# Patient Record
Sex: Female | Born: 1980 | Race: White | Hispanic: No | Marital: Married | Smoking: Never smoker
Health system: Southern US, Community
[De-identification: ages and names within clinical notes are randomized; demographics above are authoritative.]

## PROBLEM LIST (undated history)

## (undated) DIAGNOSIS — F988 Other specified behavioral and emotional disorders with onset usually occurring in childhood and adolescence: Secondary | ICD-10-CM

## (undated) DIAGNOSIS — Z8619 Personal history of other infectious and parasitic diseases: Secondary | ICD-10-CM

## (undated) DIAGNOSIS — R87619 Unspecified abnormal cytological findings in specimens from cervix uteri: Secondary | ICD-10-CM

## (undated) DIAGNOSIS — IMO0002 Reserved for concepts with insufficient information to code with codable children: Secondary | ICD-10-CM

## (undated) DIAGNOSIS — R87629 Unspecified abnormal cytological findings in specimens from vagina: Secondary | ICD-10-CM

## (undated) HISTORY — PX: COLPOSCOPY: SHX161

## (undated) HISTORY — DX: Unspecified abnormal cytological findings in specimens from cervix uteri: R87.619

## (undated) HISTORY — DX: Personal history of other infectious and parasitic diseases: Z86.19

## (undated) HISTORY — DX: Reserved for concepts with insufficient information to code with codable children: IMO0002

## (undated) HISTORY — PX: OTHER SURGICAL HISTORY: SHX169

---

## 2001-02-18 ENCOUNTER — Other Ambulatory Visit: Admission: RE | Admit: 2001-02-18 | Discharge: 2001-02-18 | Payer: Self-pay | Admitting: Obstetrics and Gynecology

## 2002-02-20 ENCOUNTER — Other Ambulatory Visit: Admission: RE | Admit: 2002-02-20 | Discharge: 2002-02-20 | Payer: Self-pay | Admitting: Obstetrics and Gynecology

## 2003-02-21 ENCOUNTER — Other Ambulatory Visit: Admission: RE | Admit: 2003-02-21 | Discharge: 2003-02-21 | Payer: Self-pay | Admitting: Obstetrics and Gynecology

## 2004-11-18 ENCOUNTER — Other Ambulatory Visit: Admission: RE | Admit: 2004-11-18 | Discharge: 2004-11-18 | Payer: Self-pay | Admitting: Obstetrics and Gynecology

## 2009-07-27 NOTE — L&D Delivery Note (Addendum)
ADMIT DATE          09/13/2009  DISCHARGE DATE       .        MOTHER'S DEMOGRAPHICS  ADMISSION DATE: 09/13/2009 07:25  PHYSICIAN GROUP: Karel Jarvis Staci Acosta)  DELIVERY PROVIDER: Burnell Blanks MD  MD REASON FOR ADMISSION: labor  ANTEPARTUM COMPLICATIONS: No Antepartum Complications  GA AT DELIVERY (Weeks.Days): 40.2  G / P: 4  FULL TERM: 2  LIVING: 2    DELIVERY INFORMATION  DELIVERY DATE AND TIME: 09/13/2009 10:54  PLACE OF DELIVERY: [facility]  TYPE OF DELIVERY: Vaginal  TYPE OF VAGINAL DELIVERY: Spontaneous  TYPE OF PLACENTA DELIVERY: Spontaneous (w/ Vag. Delivery)  EPISIOTOMY : No Episiotomy  LACERATION: No Laceration  EXTENTION: No Extension  OTHER PROCEDURES WITH DELIVERY: None  DELIVERY COMPLICATIONS: No Delivery Complications  TOTAL EBL: 300    INFANT INFORMATION  NEONATE STATUS AT DELIVERY: Living  INFANT SEX: Female  INFANT BIRTH WEIGHT (gms): 4312  TOTAL 1 MINUTE APGAR: 9  TOTAL 5 MINUTE APGAR: 9    MD ATTESTATION  ATTESTATION STATEMENT: I was present throughout the entire procedure.  PROVIDER DELIVERY NOTE: UNEVENTFUL SPONTANEOUS VAGINAL DELIVERY OF A LIVE  FEMALE INFANT  ATTESTATION STATEMENT SIGNATURE: Harlow Mares, Johnedward Brodrick.D.                    Electronically Signed and Finalized  by  Harlow Mares, MD 09/17/2009  13:44  ___________________________________________  Harlow Mares, MD      DD:   09/15/2009  DT:   09/17/2009  7:49 A  MRN/AUT#[id number]      cc:

## 2009-08-15 ENCOUNTER — Ambulatory Visit
Admit: 2009-08-15 | Discharge: 2009-08-15 | Disposition: A | Discharge status: Home / Self Care | Payer: Self-pay | Source: Ambulatory Visit | Admitting: Obstetrics and Gynecology

## 2009-08-19 LAB — GROUP B STREP CULTURE

## 2009-09-13 ENCOUNTER — Encounter: Payer: Self-pay | Admitting: Obstetrics and Gynecology

## 2009-09-13 ENCOUNTER — Inpatient Hospital Stay
Admit: 2009-09-13 | Disposition: A | Discharge status: Home / Self Care | Payer: Self-pay | Source: Ambulatory Visit | Attending: Obstetrics and Gynecology | Admitting: Obstetrics and Gynecology

## 2009-09-13 LAB — CBC AND DIFFERENTIAL
Baso # K/uL: 0 THOU/uL (ref 0.0–0.1)
Basophil %: 0.2 % (ref 0.1–1.2)
Eos # K/uL: 0.1 THOU/uL (ref 0.0–0.4)
Eosinophil %: 0.7 % (ref 0.7–5.8)
Hematocrit: 35 % (ref 34–45)
Hemoglobin: 11.9 g/dL (ref 11.2–15.7)
Lymph # K/uL: 1.9 THOU/uL (ref 1.2–3.7)
Lymphocyte %: 19.2 % — ABNORMAL LOW (ref 19.3–51.7)
MCV: 85 fL (ref 79–95)
Mono # K/uL: 0.8 THOU/uL (ref 0.2–0.9)
Monocyte %: 8.4 % (ref 4.7–12.5)
Neut # K/uL: 6.9 THOU/uL — ABNORMAL HIGH (ref 1.6–6.1)
Platelets: 205 THOU/uL (ref 160–370)
RBC: 4.1 MIL/uL (ref 3.9–5.2)
RDW: 13.7 % (ref 11.7–14.4)
Seg Neut %: 71.1 % (ref 34.0–71.1)
WBC: 9.7 THOU/uL (ref 4.0–10.0)

## 2009-09-13 LAB — HOLD SST

## 2009-09-13 LAB — TYPE AND SCREEN
ABO RH Blood Type: O POS
Antibody Screen: NEGATIVE

## 2009-09-13 LAB — RPR: RPR Screen: NONREACTIVE

## 2010-04-01 ENCOUNTER — Ambulatory Visit: Payer: Self-pay | Admitting: Vascular Surgery

## 2010-07-19 ENCOUNTER — Emergency Department
Admit: 2010-07-19 | Disposition: A | Discharge status: Home / Self Care | Payer: Self-pay | Source: Ambulatory Visit | Attending: Emergency Medicine | Admitting: Emergency Medicine

## 2010-07-19 ENCOUNTER — Other Ambulatory Visit: Payer: Self-pay | Admitting: Gastroenterology

## 2010-07-19 LAB — CBC AND DIFFERENTIAL
Baso # K/uL: 0 THOU/uL (ref 0.0–0.1)
Basophil %: 0.1 % (ref 0.1–1.2)
Eos # K/uL: 0 THOU/uL (ref 0.0–0.4)
Eosinophil %: 0.3 % — ABNORMAL LOW (ref 0.7–5.8)
Hematocrit: 36 % (ref 34–45)
Hemoglobin: 12.2 g/dL (ref 11.2–15.7)
Lymph # K/uL: 0.7 THOU/uL — ABNORMAL LOW (ref 1.2–3.7)
Lymphocyte %: 10 % — ABNORMAL LOW (ref 19.3–51.7)
MCV: 86 fL (ref 79–95)
Mono # K/uL: 0.5 THOU/uL (ref 0.2–0.9)
Monocyte %: 6.6 % (ref 4.7–12.5)
Neut # K/uL: 5.6 THOU/uL (ref 1.6–6.1)
Platelets: 186 THOU/uL (ref 160–370)
RBC: 4.2 MIL/uL (ref 3.9–5.2)
RDW: 13.8 % (ref 11.7–14.4)
Seg Neut %: 82.9 % — ABNORMAL HIGH (ref 34.0–71.1)
WBC: 6.8 THOU/uL (ref 4.0–10.0)

## 2010-07-19 LAB — POCT URINALYSIS DIPSTICK
Glucose,UA: NORMAL
Leuk Esterase,UA: NEGATIVE
Lot #: 20735301
Nitrite,UA POCT: NEGATIVE
PH,Ur: 5

## 2010-07-19 LAB — BLOOD BANK HOLD LAVENDER

## 2010-07-19 LAB — COMPREHENSIVE METABOLIC PANEL
ALT: 13 U/L (ref 0–35)
AST: 17 U/L (ref 0–35)
Albumin: 4.3 g/dL (ref 3.5–5.2)
Alk Phos: 64 U/L (ref 35–105)
Anion Gap: 12 (ref 7–16)
Bilirubin,Total: 0.4 mg/dL (ref 0.0–1.2)
CO2: 23 mmol/L (ref 20–28)
Calcium: 8.5 mg/dL — ABNORMAL LOW (ref 8.8–10.2)
Chloride: 103 mmol/L (ref 96–108)
Creatinine: 0.59 mg/dL (ref 0.51–0.95)
GFR,Black: >59 *
GFR,Caucasian: >59 *
Globulin: 2.5 g/dL — ABNORMAL LOW (ref 2.7–4.3)
Glucose: 88 mg/dL (ref 74–106)
Lab: 9 mg/dL (ref 6–20)
Potassium: 3.8 mmol/L (ref 3.3–5.1)
Sodium: 138 mmol/L (ref 133–145)
Total Protein: 6.8 g/dL (ref 6.3–7.7)

## 2010-07-19 MED ORDER — ACETAMINOPHEN-CODEINE 300-30 MG PO TABS *I*
1.0000 | ORAL_TABLET | ORAL | Status: AC | PRN
Start: 2010-07-19 — End: 2010-07-29

## 2010-07-19 NOTE — ED Provider Notes (Addendum)
 History   Chief Complaint   Patient presents with   . Fever     Pt had D&C on Wed. last week d/t miscarriage.  Started with fever this morning.  Pelvic and back pain.  No antipyretics taken this morning.  Pt noted minimal to mod amount bleeding.  G4 P3       HPI Comments: 37F healthy pw low grade fever and mild lower abd pain s/p d&c three days prior.  + Chills.  Has had decrease ambulation overall in the last two days.  Walking today and felt winded and chills. So she came in for evaluation.  On arrival she is mildly tachycarcia.      BP 110/68  Pulse 106  Temp(Src) 36.8 C (98.2 F) (Temporal)  Resp 18  Ht 1.676 m (5\' 6" )  Wt 72.576 kg (160 lb)  BMI 25.82 kg/m2  SpO2 100%  LMP 05/07/2010  Breastfeeding? Unknown    Per triage note: Pt had D&C on Wed. last week d/t miscarriage.  Started with fever this morning.  Pelvic and back pain.  No antipyretics taken this morning.  Pt noted minimal to mod amount bleeding.  G4 P3      History reviewed.  No pertinent past medical history.    No past surgical history on file.    No family history on file.         Review of Systems   Review of Systems   Constitutional: Positive for fever and fatigue.   Respiratory: Positive for cough.    All other systems reviewed and are negative.        Physical Exam   BP 110/68  Pulse 106  Temp(Src) 36.8 C (98.2 F) (Temporal)  Resp 18  Ht 1.676 m (5\' 6" )  Wt 72.576 kg (160 lb)  BMI 25.82 kg/m2  SpO2 100%  LMP 05/07/2010  Breastfeeding? Unknown    Physical Exam  Physical Exam   Constitutional: She is oriented to person, place, and time. She appears well-developed and well-nourished  HENT: mmm, no facial swelling   Head: Normocephalic and atraumatic.   Eyes: Conjunctivae and EOM are normal. PERRL  Neck: Neck supple w/ full ROM    Cardiovascular: RRR, no appreciable m/g, no palpable rib tenderness   Pulmonary/Chest: nonlabored breathing w/ nl effort & no wheezing or crackles     Abdominal: mild tenderness with palpation    Musculoskeletal: nl rom, no limb swelling, symmetric strength   Neurological: nonfocal   Skin: Skin is warm and dry. She is not diaphoretic. no appreciable rashes   GU: no active vaginal bleeding. Mild suprapubic pain with deep external pressure    Medical Decision Making   MDM  Low grade fever, tachycardia and mild abdominal pain in otherwise healthy post op pt.   Wbc - neg for infection.  Nl labs.   addl wkup-- u/a, cxr - nl lung expansion, no evidence of of infiltrate or ptx     Amount and/or Complexity of Data Reviewed  Clinical lab tests: ordered and reviewed  Tests in the radiology section of CPT: ordered and reviewed  Decide to obtain previous medical records or to obtain history from someone other than the patient: yes  Review and summarize past medical records: yes  Independent visualization of images, tracings, or specimens: yes    Siriah Treat B Warren Lacy, MD    Final disposition:   Home with incentive spirometry and reassurance     Will Bonnet, MD  07/19/10 1254  07/19/2010, 2:48 PM  I have discussed case with pt OBGYN informing him of our workup and low suspicion for endometritis.      Will Bonnet, MD  07/19/10 [phone]
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
## 2010-07-19 NOTE — Discharge Instructions (Signed)
Based on our evaluation it appears that you are stable for discharge.  Your concerns are relevant, but do not require emergency care or inpatient hospitalization at this time.   We encourage you to follow up with your obgyn for further evaluation of your post operative concerns.     Return to the ED if you have any worsening of your symptoms including severe chest pain, shortness of breath, *.  Please see additional discharge instructions attached for further patient education regarding the concerns that you had  Thank you for allowing [name] to care for you today.     If you do not have a doctor, please establish a primary care physician by calling the  [facility] at [phone] or Family Medicine at [phone].   If you are unable to get an appointment, you may also call the [facility] to establish a health care at [phone].

## 2010-08-22 LAB — EKG 12-LEAD
P: 62 degrees
QRS: 80 degrees
Rate: 93 {beats}/min
Severity: BORDERLINE
Severity: BORDERLINE
Statement: BORDERLINE
T: -24 degrees

## 2010-12-09 NOTE — Procedures (Signed)
VASCULAR LAB EXAM   INDICATION:  Right calf pain and discoloration for approximately 10  years.   HISTORY:  Diabetes:  No.  Cardiac:  No.  Hypertension:  No.   EXAM:  Duplex of left distal calf.   IMPRESSION:  1. No evidence of abnormal arterial or venous flow in the left distal      calf.  2. No evidence of AVF (arteriovenous fistula) or pseudoaneurysm.  3. Tiny superficial veins visualized in area of discoloration.   ___________________________________________  Quita Skye Hart [place], M.D.   AS/MEDQ  D:  04/02/2010  T:  04/02/2010  Job:  [id number]

## 2010-12-09 NOTE — Consult Note (Signed)
NEW PATIENT CONSULTATION   Mackenzie Howard, Mackenzie Howard  DOB:  Dec 01, 1980                                       04/01/2010  KGMWN#:[id number]   The patient is a healthy 30 year old female referred for evaluation of a  prominent lump in her left ankle area.  She states that about 8 or 9  years ago a softball struck this area and since that time she has had  some local swelling and occasional mild discomfort.  She has had no  history of any fracture, deep venous thrombosis, thrombophlebitis,  claudication or infection in the lower extremity.  It is not really  painful unless one touches it and manipulates it.   CHRONIC MEDICAL PROBLEMS:  None.  Denies any diabetes, hypertension,  hyperlipidemia, coronary artery disease, COPD.   SOCIAL HISTORY:  She is married, has no children.  She is an  Acupuncturist at [employer].  Does not use tobacco.  Drinks  occasional alcohol.   FAMILY HISTORY:  Negative for coronary artery disease, diabetes or  stroke.   REVIEW OF SYSTEMS:  Totally negative.  Denies any symptoms abnormal.   PHYSICAL EXAMINATION:  Vital signs:  Blood pressure 94/64, heart rate  67, respirations 14, temperature 98.  General:  She is a well-developed,  well-nourished female in no apparent distress, alert and oriented x3.  HEENT:  Exam normal for age.  EOMs intact.  Lungs:  Clear to  auscultation.  Cardiovascular:  Regular rhythm.  No murmurs.  Carotid  pulses 3+.  No audible bruits.  Abdomen:  Soft, nontender with no  masses.  Musculoskeletal:  Exam is free of major deformities.  Neurological:  Normal.  Skin:  Free of rashes.  Lower extremity:  Exam  reveals 3+ femoral, popliteal and posterior tibial pulses palpable  bilaterally.  There is an area of some localized mild swelling proximal  to left medial malleolus over the distal saphenous vein with a few  prominent venules beneath the skin.  There is no bruit or evidence of AV  fistula by auscultation.   There is no calf edema and the calves are of  equal circumference bilaterally.   I ordered a venous ultrasound of this area to check for any arterial  venous connections or abnormalities and the study was completely normal.  No evidence of AV fistula or pseudoaneurysm.  I reassured her regarding  these findings, do not think there is any treatment available nor is  there anything to be concerned about and she will return to see [name] on a  p.r.n. basis.     Quita Skye Hart [place], M.D.  Electronically Signed   JDL/MEDQ  D:  04/01/2010  T:  04/02/2010  Job:  [id number]   cc:   Gretta Arab. Valentina Lucks, M.D.

## 2012-07-05 LAB — OB RESULTS CONSOLE RPR: RPR: NONREACTIVE

## 2012-07-05 LAB — OB RESULTS CONSOLE ABO/RH: RH Type: POSITIVE

## 2013-01-09 LAB — OB RESULTS CONSOLE GBS: GBS: NEGATIVE

## 2013-02-01 ENCOUNTER — Encounter (HOSPITAL_COMMUNITY): Payer: Self-pay | Admitting: *Deleted

## 2013-02-01 ENCOUNTER — Telehealth (HOSPITAL_COMMUNITY): Payer: Self-pay | Admitting: *Deleted

## 2013-02-01 NOTE — Telephone Encounter (Signed)
Preadmission screen  

## 2013-02-11 ENCOUNTER — Inpatient Hospital Stay (HOSPITAL_COMMUNITY)
Admission: RE | Admit: 2013-02-11 | Discharge: 2013-02-14 | DRG: 775 | Disposition: A | Discharge status: Home / Self Care | Payer: 59 | Source: Ambulatory Visit | Attending: Obstetrics and Gynecology | Admitting: Obstetrics and Gynecology

## 2013-02-11 ENCOUNTER — Encounter (HOSPITAL_COMMUNITY): Payer: Self-pay

## 2013-02-11 LAB — CBC
HCT: 35.7 % — ABNORMAL LOW (ref 36.0–46.0)
Hemoglobin: 12.1 g/dL (ref 12.0–15.0)
MCH: 30.8 pg (ref 26.0–34.0)
MCV: 90.8 fL (ref 78.0–100.0)
RBC: 3.93 MIL/uL (ref 3.87–5.11)

## 2013-02-11 MED ORDER — OXYCODONE-ACETAMINOPHEN 5-325 MG PO TABS
1.0000 | ORAL_TABLET | ORAL | Status: DC | PRN
  Administered 2013-02-12: 1 via ORAL
  Filled 2013-02-11: qty 1

## 2013-02-11 MED ORDER — ONDANSETRON HCL 4 MG/2ML IJ SOLN: 4.0000 mg | Freq: Four times a day (QID) | INTRAMUSCULAR | Status: DC | PRN

## 2013-02-11 MED ORDER — TERBUTALINE SULFATE 1 MG/ML IJ SOLN: 0.2500 mg | Freq: Once | INTRAMUSCULAR | Status: AC | PRN

## 2013-02-11 MED ORDER — FLEET ENEMA 7-19 GM/118ML RE ENEM: 1.0000 | ENEMA | Freq: Every day | RECTAL | Status: DC | PRN

## 2013-02-11 MED ORDER — ACETAMINOPHEN 325 MG PO TABS: 650.0000 mg | ORAL_TABLET | ORAL | Status: DC | PRN

## 2013-02-11 MED ORDER — BUTORPHANOL TARTRATE 1 MG/ML IJ SOLN
1.0000 mg | INTRAMUSCULAR | Status: DC | PRN
  Administered 2013-02-12: 1 mg via INTRAVENOUS
  Filled 2013-02-11: qty 1

## 2013-02-11 MED ORDER — LACTATED RINGERS IV SOLN: 500.0000 mL | INTRAVENOUS | Status: DC | PRN

## 2013-02-11 MED ORDER — CITRIC ACID-SODIUM CITRATE 334-500 MG/5ML PO SOLN: 30.0000 mL | ORAL | Status: DC | PRN

## 2013-02-11 MED ORDER — OXYTOCIN 40 UNITS IN LACTATED RINGERS INFUSION - SIMPLE MED
62.5000 mL/h | INTRAVENOUS | Status: DC
  Administered 2013-02-12: 62.5 mL/h via INTRAVENOUS
  Filled 2013-02-11: qty 1000

## 2013-02-11 MED ORDER — OXYTOCIN BOLUS FROM INFUSION: 500.0000 mL | INTRAVENOUS | Status: DC

## 2013-02-11 MED ORDER — MISOPROSTOL 25 MCG QUARTER TABLET
25.0000 ug | ORAL_TABLET | ORAL | Status: DC | PRN
  Administered 2013-02-11 – 2013-02-12 (×2): 25 ug via VAGINAL
  Filled 2013-02-11 (×2): qty 0.25

## 2013-02-11 MED ORDER — LIDOCAINE HCL (PF) 1 % IJ SOLN: 30.0000 mL | INTRAMUSCULAR | Status: DC | PRN

## 2013-02-11 MED ORDER — LACTATED RINGERS IV SOLN
INTRAVENOUS | Status: DC
  Administered 2013-02-12 (×2): via INTRAVENOUS

## 2013-02-11 MED ORDER — ZOLPIDEM TARTRATE 5 MG PO TABS: 5.0000 mg | ORAL_TABLET | Freq: Every evening | ORAL | Status: DC | PRN

## 2013-02-11 MED ORDER — IBUPROFEN 600 MG PO TABS
600.0000 mg | ORAL_TABLET | Freq: Four times a day (QID) | ORAL | Status: DC | PRN
  Administered 2013-02-12: 600 mg via ORAL
  Filled 2013-02-11: qty 1

## 2013-02-11 NOTE — H&P (Signed)
Mackenzie Howard is a 32 y.o. female presenting for IOL. Denies ROM/bleeding, no HA/blurry vision, no epigastric pain.                                                                                                                                                                                Maternal Medical History:  Fetal activity: Perceived fetal activity is normal.      OB History   Grav Para Term Preterm Abortions TAB SAB Ect Mult Living   1              Past Medical History  Diagnosis Date  . Hx of varicella   . Abnormal Pap smear    Past Surgical History  Procedure Laterality Date  . Colposcopy     Family History: family history includes Cancer in her maternal grandfather; Heart disease in her maternal grandfather; and Hypertension in her father. Social History:  reports that she has never smoked. She has never used smokeless tobacco. She reports that she does not drink alcohol or use illicit drugs.   Prenatal Transfer Tool  Maternal Diabetes: No Genetic Screening: Normal Maternal Ultrasounds/Referrals: Normal Fetal Ultrasounds or other Referrals:  None Maternal Substance Abuse:  No Significant Maternal Medications:  None Significant Maternal Lab Results:  None Other Comments:  None  Review of Systems  Eyes: Negative for blurred vision.  Gastrointestinal: Negative for abdominal pain.  Neurological: Negative for headaches.    Dilation: 1.5 Effacement (%): 70 Station: -2 Exam by:: JDaley Blood pressure 106/75, pulse 85, temperature 97.8 F (36.6 C), temperature source Oral, resp. rate 18, height 5\' 4"  (1.626 m), weight 174 lb (78.926 kg), last menstrual period 05/07/2012. Maternal Exam:  Uterine Assessment: Contraction strength is mild.  Contraction frequency is irregular.      Fetal Exam Fetal Monitor Review: Pattern: accelerations present.       Physical Exam  Cardiovascular: Normal rate and regular rhythm.   Respiratory: Effort normal and breath sounds  normal.  GI: Soft. There is no tenderness.  Neurological: She has normal reflexes.    Prenatal labs: ABO, Rh: O/Positive/-- (12/10 0000) Antibody: Negative (12/10 0000) Rubella: Immune (12/10 0000) RPR: Nonreactive (12/10 0000)  HBsAg: Negative (12/10 0000)  HIV: Non-reactive (12/10 0000)  GBS: Negative (06/16 0000)   Assessment/Plan: 32 yo G1P0 at term for IOL Induction and risks have been reviewed Two stage cytotec induction   Mackenzie Howard,Mackenzie Howard 02/11/2013, 9:11 PM

## 2013-02-12 ENCOUNTER — Encounter (HOSPITAL_COMMUNITY): Payer: Self-pay

## 2013-02-12 ENCOUNTER — Inpatient Hospital Stay (HOSPITAL_COMMUNITY): Payer: 59 | Admitting: Anesthesiology

## 2013-02-12 ENCOUNTER — Inpatient Hospital Stay (HOSPITAL_COMMUNITY): Admission: AD | Admit: 2013-02-12 | Payer: Self-pay | Source: Ambulatory Visit | Admitting: Obstetrics and Gynecology

## 2013-02-12 ENCOUNTER — Encounter (HOSPITAL_COMMUNITY): Payer: Self-pay | Admitting: Anesthesiology

## 2013-02-12 MED ORDER — FENTANYL 2.5 MCG/ML BUPIVACAINE 1/10 % EPIDURAL INFUSION (WH - ANES)
INTRAMUSCULAR | Status: DC | PRN
  Administered 2013-02-12: 14 mL/h via EPIDURAL

## 2013-02-12 MED ORDER — FLEET ENEMA 7-19 GM/118ML RE ENEM: 1.0000 | ENEMA | Freq: Every day | RECTAL | Status: DC | PRN

## 2013-02-12 MED ORDER — BISACODYL 10 MG RE SUPP: 10.0000 mg | Freq: Every day | RECTAL | Status: DC | PRN

## 2013-02-12 MED ORDER — ONDANSETRON HCL 4 MG/2ML IJ SOLN: 4.0000 mg | INTRAMUSCULAR | Status: DC | PRN

## 2013-02-12 MED ORDER — SIMETHICONE 80 MG PO CHEW: 80.0000 mg | CHEWABLE_TABLET | ORAL | Status: DC | PRN

## 2013-02-12 MED ORDER — FENTANYL 2.5 MCG/ML BUPIVACAINE 1/10 % EPIDURAL INFUSION (WH - ANES)
14.0000 mL/h | INTRAMUSCULAR | Status: DC | PRN
  Filled 2013-02-12: qty 125

## 2013-02-12 MED ORDER — EPHEDRINE 5 MG/ML INJ: 10.0000 mg | INTRAVENOUS | Status: DC | PRN

## 2013-02-12 MED ORDER — OXYTOCIN 40 UNITS IN LACTATED RINGERS INFUSION - SIMPLE MED
1.0000 m[IU]/min | INTRAVENOUS | Status: DC
  Administered 2013-02-12: 4 m[IU]/min via INTRAVENOUS
  Administered 2013-02-12: 8 m[IU]/min via INTRAVENOUS
  Administered 2013-02-12: 2 m[IU]/min via INTRAVENOUS
  Administered 2013-02-12: 6 m[IU]/min via INTRAVENOUS

## 2013-02-12 MED ORDER — LIDOCAINE HCL (PF) 1 % IJ SOLN
INTRAMUSCULAR | Status: DC | PRN
  Administered 2013-02-12 (×2): 4 mL

## 2013-02-12 MED ORDER — BENZOCAINE-MENTHOL 20-0.5 % EX AERO
1.0000 "application " | INHALATION_SPRAY | CUTANEOUS | Status: DC | PRN
  Filled 2013-02-12 (×2): qty 56

## 2013-02-12 MED ORDER — EPHEDRINE 5 MG/ML INJ
10.0000 mg | INTRAVENOUS | Status: DC | PRN
  Filled 2013-02-12: qty 4

## 2013-02-12 MED ORDER — TETANUS-DIPHTH-ACELL PERTUSSIS 5-2.5-18.5 LF-MCG/0.5 IM SUSP: 0.5000 mL | Freq: Once | INTRAMUSCULAR | Status: DC

## 2013-02-12 MED ORDER — DIPHENHYDRAMINE HCL 25 MG PO CAPS: 25.0000 mg | ORAL_CAPSULE | Freq: Four times a day (QID) | ORAL | Status: DC | PRN

## 2013-02-12 MED ORDER — PHENYLEPHRINE 40 MCG/ML (10ML) SYRINGE FOR IV PUSH (FOR BLOOD PRESSURE SUPPORT)
80.0000 ug | PREFILLED_SYRINGE | INTRAVENOUS | Status: DC | PRN
  Filled 2013-02-12: qty 5

## 2013-02-12 MED ORDER — WITCH HAZEL-GLYCERIN EX PADS: 1.0000 "application " | MEDICATED_PAD | CUTANEOUS | Status: DC | PRN

## 2013-02-12 MED ORDER — SENNOSIDES-DOCUSATE SODIUM 8.6-50 MG PO TABS
2.0000 | ORAL_TABLET | Freq: Every day | ORAL | Status: DC
  Administered 2013-02-13 (×2): 2 via ORAL

## 2013-02-12 MED ORDER — OXYCODONE-ACETAMINOPHEN 5-325 MG PO TABS
1.0000 | ORAL_TABLET | ORAL | Status: DC | PRN
  Administered 2013-02-13: 1 via ORAL
  Filled 2013-02-12 (×2): qty 1

## 2013-02-12 MED ORDER — TERBUTALINE SULFATE 1 MG/ML IJ SOLN: 0.2500 mg | Freq: Once | INTRAMUSCULAR | Status: DC | PRN

## 2013-02-12 MED ORDER — PHENYLEPHRINE 40 MCG/ML (10ML) SYRINGE FOR IV PUSH (FOR BLOOD PRESSURE SUPPORT): 80.0000 ug | PREFILLED_SYRINGE | INTRAVENOUS | Status: DC | PRN

## 2013-02-12 MED ORDER — DIPHENHYDRAMINE HCL 50 MG/ML IJ SOLN: 12.5000 mg | INTRAMUSCULAR | Status: DC | PRN

## 2013-02-12 MED ORDER — ZOLPIDEM TARTRATE 5 MG PO TABS: 5.0000 mg | ORAL_TABLET | Freq: Every evening | ORAL | Status: DC | PRN

## 2013-02-12 MED ORDER — LANOLIN HYDROUS EX OINT: TOPICAL_OINTMENT | CUTANEOUS | Status: DC | PRN

## 2013-02-12 MED ORDER — DIBUCAINE 1 % RE OINT: 1.0000 "application " | TOPICAL_OINTMENT | RECTAL | Status: DC | PRN

## 2013-02-12 MED ORDER — LACTATED RINGERS IV SOLN: 500.0000 mL | Freq: Once | INTRAVENOUS | Status: DC

## 2013-02-12 MED ORDER — IBUPROFEN 600 MG PO TABS
600.0000 mg | ORAL_TABLET | Freq: Four times a day (QID) | ORAL | Status: DC
  Administered 2013-02-13 – 2013-02-14 (×5): 600 mg via ORAL
  Filled 2013-02-12 (×5): qty 1

## 2013-02-12 MED ORDER — PRENATAL MULTIVITAMIN CH
1.0000 | ORAL_TABLET | Freq: Every day | ORAL | Status: DC
  Administered 2013-02-13: 1 via ORAL
  Filled 2013-02-12: qty 1

## 2013-02-12 MED ORDER — ONDANSETRON HCL 4 MG PO TABS: 4.0000 mg | ORAL_TABLET | ORAL | Status: DC | PRN

## 2013-02-12 NOTE — Progress Notes (Signed)
Delivery Note Good progress in second stage (about 50 minutes), FHT reactive Vtx delivers without difficulty. OA Anterior shoulder delivers with normal gentle traction Posterior shoulder will not deliver with regular gentle traction, therefore second degree MLE done and allows easy delivery of posterior shoulder Placenta intact, 3 vessels, EBL 500cc Cx vagina intact First degree antior midline lac not repaired Second degree MLE repaired in layers without difficulty Patient and infant stable in LDR

## 2013-02-12 NOTE — Anesthesia Preprocedure Evaluation (Signed)

## 2013-02-12 NOTE — Progress Notes (Signed)
FHT reactive UCs q2-3 min Cx 3/90/-1/vtx AROM clear Pitocin running

## 2013-02-12 NOTE — Anesthesia Procedure Notes (Signed)
Epidural Patient location during procedure: OB Start time: 02/12/2013 10:38 AM  Staffing Anesthesiologist: Lyzette Reinhardt A. Performed by: anesthesiologist   Preanesthetic Checklist Completed: patient identified, site marked, surgical consent, pre-op evaluation, timeout performed, IV checked, risks and benefits discussed and monitors and equipment checked  Epidural Patient position: sitting Prep: site prepped and draped and DuraPrep Patient monitoring: continuous pulse ox and blood pressure Approach: midline Injection technique: LOR air  Needle:  Needle type: Tuohy  Needle gauge: 17 G Needle length: 9 cm and 9 Needle insertion depth: 5 cm cm Catheter type: closed end flexible Catheter size: 19 Gauge Catheter at skin depth: 10 cm Test dose: negative and Other  Assessment Events: blood not aspirated, injection not painful, no injection resistance, negative IV test and no paresthesia  Additional Notes Patient identified. Risks and benefits discussed including failed block, incomplete  Pain control, post dural puncture headache, nerve damage, paralysis, blood pressure Changes, nausea, vomiting, reactions to medications-both toxic and allergic and post Partum back pain. All questions were answered. Patient expressed understanding and wished to proceed. Sterile technique was used throughout procedure. Epidural site was Dressed with sterile barrier dressing. No paresthesias, signs of intravascular injection Or signs of intrathecal spread were encountered.  Patient was more comfortable after the epidural was dosed. Please see RN's note for documentation of vital signs and FHR which are stable.

## 2013-02-13 ENCOUNTER — Encounter (HOSPITAL_COMMUNITY): Payer: Self-pay

## 2013-02-13 LAB — CBC
Hemoglobin: 10.2 g/dL — ABNORMAL LOW (ref 12.0–15.0)
MCH: 29.7 pg (ref 26.0–34.0)
RBC: 3.43 MIL/uL — ABNORMAL LOW (ref 3.87–5.11)
WBC: 13.4 10*3/uL — ABNORMAL HIGH (ref 4.0–10.5)

## 2013-02-13 NOTE — Progress Notes (Addendum)
Post Partum Day 1 Subjective: no complaints, up ad lib, voiding and tolerating PO  Objective: Blood pressure 100/67, pulse 78, temperature 97.4 F (36.3 C), temperature source Oral, resp. rate 18, height 5\' 4"  (1.626 m), weight 174 lb (78.926 kg), last menstrual period 05/07/2012, SpO2 98.00%.  Physical Exam:  General: alert and cooperative Lochia: appropriate Uterine Fundus: firm Incision: perineum intact, small labial edema noted DVT Evaluation: No evidence of DVT seen on physical exam. Negative Homan's sign. No cords or calf tenderness. No significant calf/ankle edema.   Recent Labs  02/11/13 1955 02/13/13 0555  HGB 12.1 10.2*  HCT 35.7* 31.0*    Assessment/Plan: Plan for discharge tomorrow   LOS: 2 days   Mackenzie Howard G 02/13/2013, 8:05 AM

## 2013-02-13 NOTE — Anesthesia Postprocedure Evaluation (Signed)
Anesthesia Post Note  Patient: Mackenzie Howard  Procedure(s) Performed: * No procedures listed *  Anesthesia type: Epidural  Patient location: Mother/Baby  Post pain: Pain level controlled  Post assessment: Post-op Vital signs reviewed  Last Vitals:  Filed Vitals:   02/13/13 0559  BP: 100/67  Pulse: 78  Temp: 36.3 C  Resp: 18    Post vital signs: Reviewed  Level of consciousness:alert  Complications: No apparent anesthesia complications

## 2013-02-14 MED ORDER — IBUPROFEN 600 MG PO TABS: 600.0000 mg | ORAL_TABLET | Freq: Four times a day (QID) | ORAL | Status: DC

## 2013-02-14 MED ORDER — OXYCODONE-ACETAMINOPHEN 5-325 MG PO TABS: 1.0000 | ORAL_TABLET | ORAL | Status: DC | PRN

## 2013-02-14 NOTE — Discharge Summary (Signed)
Obstetric Discharge Summary Reason for Admission: induction of labor Prenatal Procedures: ultrasound Intrapartum Procedures: spontaneous vaginal delivery Postpartum Procedures: none Complications-Operative and Postpartum: 2 degree perineal laceration Hemoglobin  Date Value Range Status  02/13/2013 10.2* 12.0 - 15.0 g/dL Final     HCT  Date Value Range Status  02/13/2013 31.0* 36.0 - 46.0 % Final    Physical Exam:  General: alert and cooperative Lochia: appropriate Uterine Fundus: firm Incision: perineum intact DVT Evaluation: No evidence of DVT seen on physical exam. Negative Homan's sign. No cords or calf tenderness. No significant calf/ankle edema.  Discharge Diagnoses: Term Pregnancy-delivered  Discharge Information: Date: 02/14/2013 Activity: pelvic rest Diet: routine Medications: PNV, Ibuprofen and Percocet Condition: stable Instructions: refer to practice specific booklet Discharge to: home   Newborn Data: Live born female  Birth Weight: 9 lb 1.7 oz (4131 g) APGAR: 8, 9  Home with mother.  Mackenzie Howard G 02/14/2013, 8:27 AM

## 2013-04-07 DIAGNOSIS — F909 Attention-deficit hyperactivity disorder, unspecified type: Secondary | ICD-10-CM | POA: Insufficient documentation

## 2013-04-27 ENCOUNTER — Encounter: Payer: Self-pay | Admitting: Geriatric Medicine

## 2013-04-27 ENCOUNTER — Emergency Department: Admit: 2013-04-27 | Disposition: A | Discharge status: Home / Self Care | Payer: Self-pay | Source: Ambulatory Visit

## 2013-04-27 HISTORY — DX: Other specified behavioral and emotional disorders with onset usually occurring in childhood and adolescence: F98.8

## 2013-04-27 LAB — POCT URINALYSIS DIPSTICK
Glucose,UA POCT: NORMAL
Ketones,UA POCT: NEGATIVE
Leuk Esterase,UA POCT: NEGATIVE
Lot #: 22916301
Nitrite,UA POCT: NEGATIVE
PH,UA POCT: 8 (ref 5–8)
Protein,UA POCT: NEGATIVE mg/dL

## 2013-04-27 LAB — COMPREHENSIVE METABOLIC PANEL
ALT: 14 U/L (ref 0–35)
AST: 18 U/L (ref 0–35)
Albumin: 4.7 g/dL (ref 3.5–5.2)
Alk Phos: 53 U/L (ref 35–105)
Anion Gap: 11 (ref 7–16)
Bilirubin,Total: 0.3 mg/dL (ref 0.0–1.2)
CO2: 26 mmol/L (ref 20–28)
Calcium: 9.2 mg/dL (ref 8.8–10.2)
Chloride: 103 mmol/L (ref 96–108)
Creatinine: 0.71 mg/dL (ref 0.51–0.95)
GFR,Black: 131 *
GFR,Caucasian: 114 *
Globulin: 2.5 g/dL — ABNORMAL LOW (ref 2.7–4.3)
Glucose: 93 mg/dL (ref 60–99)
Lab: 8 mg/dL (ref 6–20)
Potassium: 3.6 mmol/L (ref 3.3–5.1)
Sodium: 140 mmol/L (ref 133–145)
Total Protein: 7.2 g/dL (ref 6.3–7.7)

## 2013-04-27 LAB — CBC AND DIFFERENTIAL
Baso # K/uL: 0 10*3/uL (ref 0.0–0.1)
Basophil %: 0.2 % (ref 0.1–1.2)
Eos # K/uL: 0 10*3/uL (ref 0.0–0.4)
Eosinophil %: 0.3 % — ABNORMAL LOW (ref 0.7–5.8)
Hematocrit: 40 % (ref 34–45)
Hemoglobin: 13.7 g/dL (ref 11.2–15.7)
Lymph # K/uL: 1.3 10*3/uL (ref 1.2–3.7)
Lymphocyte %: 21.1 % (ref 19.3–51.7)
MCV: 87 fL (ref 79–95)
Mono # K/uL: 0.4 10*3/uL (ref 0.2–0.9)
Monocyte %: 6.2 % (ref 4.7–12.5)
Neut # K/uL: 4.4 10*3/uL (ref 1.6–6.1)
Platelets: 235 10*3/uL (ref 160–370)
RBC: 4.6 MIL/uL (ref 3.9–5.2)
RDW: 13 % (ref 11.7–14.4)
Seg Neut %: 72 % — ABNORMAL HIGH (ref 34.0–71.1)
WBC: 6.2 10*3/uL (ref 4.0–10.0)

## 2013-04-27 LAB — POCT URINE PREGNANCY
Exp date: 25
Lot #: 104663

## 2013-04-27 LAB — BLOOD BANK HOLD LAVENDER

## 2013-04-27 LAB — HOLD GREEN WITH GEL

## 2013-04-27 LAB — HOLD BLUE

## 2013-04-27 LAB — HOLD RED

## 2013-04-27 MED ORDER — DIAZEPAM 5 MG/ML IJ SOLN *I*
2.5000 mg | Freq: Once | INTRAMUSCULAR | Status: AC
Start: 2013-04-27 — End: 2013-04-27
  Administered 2013-04-27: 2.5 mg via INTRAVENOUS
  Filled 2013-04-27: qty 2

## 2013-04-27 MED ORDER — SODIUM CHLORIDE 0.9 % IV BOLUS *I*
1000.0000 mL | Freq: Once | Status: AC
Start: 2013-04-27 — End: 2013-04-27
  Administered 2013-04-27: 1000 mL via INTRAVENOUS
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
## 2013-04-27 NOTE — ED Notes (Signed)
Discharge instructions reviewed with pt. Pt to follow up with PCP. Pt to return to ED if symptoms worsen or persists.
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
## 2013-04-27 NOTE — Discharge Instructions (Signed)
Discharge Instructions for Patients receiving   Contrast Medium    04/27/2013  6:42 PM    INFORMATION  I.V. contrast is eliminated through the kidneys.  Oral and rectal contrasts are not absorbed by the body and will be eliminated through the gastrointestinal tract.  Side effects and allergic reactions to contrast mediums are not common but can occur up to 48 hours after your scan.    INSTRUCTIONS   1. You will need to drink four 8 oz glasses of fluid over the next four hours, unless your medical condition does not allow you to do this.  Please speak with an [facility] nurse if you have any questions or concerns about this.  2. Mild headache may occur following contrast injection.  3. The kidneys excrete the contrast.  Your urine will NOT become discolored.  4. You may experience loose stools/diarrhea for approximately 24 hours after drinking oral contrast.  5. You may experience watery stool immediately after rectal contrast.  6.  If you are a DIABETIC and take Actos Plus Met, Actos Plus Met XR, Avandamet, Foramet, Glucophage, Glucophage XR, Glucovance, Glumetza, Janumet, Janumet XR, Jentadueto, Kazano, Kombiglyze XR, Metaglip, PrandiMet, Riomet or Metformin, hold this medication for 48 hours (2 days) after your exam.  Check with your doctor for management of your diabetes during this time and before restarting these medications.  Your doctor will have to address the need for kidney function blood tests before restarting your diabetes medication.    7.  There is no evidence that the contrast you have received would be harmful to your breastfed child.  If you choose, you may pump and discard your breast milk for the next 24 hours.    Delayed effects from contrast are not common.  However, notify your doctor IMMEDIATELY:    If nausea or vomiting occurs   If any itching, hives, wheezing, or rashes occur   Severe or throbbing headache    Monday - Friday 8am-5pm: For questions or concerns regarding your  procedure please call [facility] [phone]  OR [facility] [phone].    After hours/Weekends:  [facility] patients may call [phone] and ask to speak with the Radiology resident on call.   [facility] patients may call [phone] to speak with the [facility] nurse or call [phone] and ask to speak with the radiology resident on call.  The Emergency Department is open 24 hours a day at [facility] and [place] if emergency treatment is required.       Please use ice on your injury - apply for 20 minutes every 1-2 hours while awake for next 2 days.   Please use a heating pad on your injury - apply for 20 minutes every 1-2 hours for comfort. Do not fall asleep with the heating pad on as this can lead to a burn.  Take flexeril as directed   Ibuprofen as directed  Plenty of fluids

## 2013-04-30 ENCOUNTER — Emergency Department
Admission: EM | Admit: 2013-04-30 | Disposition: A | Discharge status: Home / Self Care | Payer: Self-pay | Source: Ambulatory Visit | Attending: Emergency Medicine | Admitting: Emergency Medicine

## 2013-04-30 ENCOUNTER — Encounter: Payer: Self-pay | Admitting: Emergency Medicine

## 2013-04-30 MED ORDER — OXYCODONE-ACETAMINOPHEN 5-325 MG PO TABS *I*
1.0000 | ORAL_TABLET | Freq: Once | ORAL | Status: AC
Start: 2013-04-30 — End: 2013-04-30
  Administered 2013-04-30: 1 via ORAL
  Filled 2013-04-30: qty 1

## 2013-04-30 MED ORDER — CYCLOBENZAPRINE HCL 10 MG PO TABS *I*
5.0000 mg | ORAL_TABLET | Freq: Once | ORAL | Status: AC
Start: 2013-04-30 — End: 2013-04-30
  Administered 2013-04-30: 5 mg via ORAL
  Filled 2013-04-30: qty 1

## 2013-04-30 NOTE — ED Provider Progress Notes (Signed)
ED Provider Progress Note    Patient had a negative cervical spine CT on Tuesday at [facility]. Will discharge to home with collar and ortho spine number. Instructed to take 600mg  TID of ibuprofen as well.        Weston Settle, MD, 04/30/2013, 10:33 AM
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
## 2013-04-30 NOTE — Discharge Instructions (Signed)
You were diagnosed with a neck strain. Take ibuprofen 600mg  every 8 hours for your pain. Otherwise, you can also take your flexeril as needed.     If you develop any new or worrisome symptoms, call your doctor.     -Keep the collar on as well. Call ortho spine for a follow-up appointment tomorrow at [phone].

## 2013-05-02 ENCOUNTER — Encounter: Payer: Self-pay | Admitting: Orthopedic Surgery

## 2013-05-02 ENCOUNTER — Encounter: Payer: Self-pay | Admitting: Gastroenterology

## 2013-05-02 ENCOUNTER — Ambulatory Visit: Payer: Self-pay | Admitting: Orthopedic Surgery

## 2013-05-02 ENCOUNTER — Ambulatory Visit: Payer: Self-pay

## 2013-05-02 VITALS — BP 132/77 | HR 91 | Ht 66.0 in | Wt 148.0 lb

## 2013-05-02 DIAGNOSIS — M5020 Other cervical disc displacement, unspecified cervical region: Secondary | ICD-10-CM

## 2013-05-02 MED ORDER — MELOXICAM 15 MG PO TABS *I*
15.0000 mg | ORAL_TABLET | Freq: Every day | ORAL | Status: DC
Start: 2013-05-02 — End: 2021-07-24

## 2013-05-02 NOTE — Patient Instructions (Signed)
[facility]      Your physician has determined that you require Prefabricated (off-the-shelf) Orthotic and Prosthetic (O&P) devices and/or Durable Medical Equipment (DME).  O&P devices include items such as braces for the spine or limbs, while DME includes items such as canes, crutches and walkers.  For your convenience you were fitted by the [facility].    Return Policy:    Prefabricated O&P and DME items are not returnable if used outside of clinic due to hygiene concerns.    Special or custom orders are not returnable or refundable.    O&P devices and DME purchased from the [facility] may only be exchanged if the item is faulty or poorly fitting, as determined by the O&P Department Clinical Coordinator.  Exchanges will be made using the same type of device, if within 7 days of the purchase date.    No exchange will be issued for items that were not used in accordance with the manufacturer’s recommended standard of care.    Replacement or repair of minor parts could become necessary due to wear.  This may include a charge and an order from you physician may be required.
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
## 2013-05-02 NOTE — Progress Notes (Signed)
Walk-in Visit    Patient name: Rachel Spears     Pain    05/02/13 1747   PainSc:   3       Laterality: N/A    Device:  Foam Contoured Collar    Functional Goals: Provide joint stability    ROM settings: Not Applicable    Expected Frequency / Duration of Use:   Daily    Brand: SPS                 Model: Soft Cervical Collar    Size: medium    Modifications made: Not Applicable    Complexity:   Fitting was routine, requiring little to no adjustments    Yes Device was inspected for safety/security and found to be functioning properly    Ordered/Delivered: Device Delivered    The patient given verbal and written instructions.  The following Instructions were reviewed:   Purpose of device, Cleaning / Care of device, Potential Risks / Benefits, Frequency / Duration of use and How to report potential failure / malfunctions    Device Comfort Score: NA

## 2013-05-04 ENCOUNTER — Encounter: Payer: Self-pay | Admitting: Orthopedic Surgery

## 2013-05-12 ENCOUNTER — Encounter: Payer: Self-pay | Admitting: Gastroenterology

## 2013-06-26 ENCOUNTER — Ambulatory Visit: Payer: Self-pay | Admitting: Orthopedic Surgery

## 2014-05-28 ENCOUNTER — Encounter (HOSPITAL_COMMUNITY): Payer: Self-pay

## 2014-12-05 ENCOUNTER — Ambulatory Visit
Admit: 2014-12-05 | Discharge: 2014-12-05 | Disposition: A | Discharge status: Home / Self Care | Payer: Self-pay | Source: Ambulatory Visit | Attending: Occupational Medicine | Admitting: Occupational Medicine

## 2014-12-05 LAB — DATE/TIME NOT PROVIDED

## 2014-12-06 LAB — RUBELLA ANTIBODY, IGG: Rubella IgG AB: POSITIVE

## 2014-12-06 LAB — MEASLES IGG AB: Measles IgG: POSITIVE

## 2014-12-18 ENCOUNTER — Encounter (HOSPITAL_COMMUNITY): Payer: Self-pay

## 2014-12-18 ENCOUNTER — Encounter: Payer: Self-pay | Admitting: Vascular Surgery

## 2014-12-25 ENCOUNTER — Other Ambulatory Visit: Payer: Self-pay | Admitting: *Deleted

## 2014-12-25 DIAGNOSIS — M79605 Pain in left leg: Secondary | ICD-10-CM

## 2014-12-31 ENCOUNTER — Encounter: Payer: Self-pay | Admitting: Vascular Surgery

## 2015-01-01 ENCOUNTER — Encounter: Payer: Self-pay | Admitting: Vascular Surgery

## 2015-01-01 ENCOUNTER — Ambulatory Visit (INDEPENDENT_AMBULATORY_CARE_PROVIDER_SITE_OTHER): Payer: 59 | Admitting: Vascular Surgery

## 2015-01-01 ENCOUNTER — Other Ambulatory Visit: Payer: Self-pay | Admitting: Vascular Surgery

## 2015-01-01 ENCOUNTER — Ambulatory Visit (HOSPITAL_COMMUNITY)
Admission: RE | Admit: 2015-01-01 | Discharge: 2015-01-01 | Disposition: A | Discharge status: Home / Self Care | Payer: 59 | Source: Ambulatory Visit | Attending: Vascular Surgery | Admitting: Vascular Surgery

## 2015-01-01 VITALS — BP 101/71 | HR 76 | Resp 18 | Ht 64.25 in | Wt 130.6 lb

## 2015-01-01 DIAGNOSIS — M7989 Other specified soft tissue disorders: Secondary | ICD-10-CM | POA: Diagnosis not present

## 2015-01-01 DIAGNOSIS — M79605 Pain in left leg: Secondary | ICD-10-CM | POA: Diagnosis not present

## 2015-01-01 DIAGNOSIS — I781 Nevus, non-neoplastic: Secondary | ICD-10-CM

## 2015-01-01 DIAGNOSIS — I789 Disease of capillaries, unspecified: Secondary | ICD-10-CM | POA: Diagnosis not present
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
## 2015-02-14 ENCOUNTER — Encounter: Payer: Self-pay | Admitting: Family

## 2015-02-15 ENCOUNTER — Other Ambulatory Visit: Payer: Self-pay

## 2015-02-15 ENCOUNTER — Ambulatory Visit (INDEPENDENT_AMBULATORY_CARE_PROVIDER_SITE_OTHER): Payer: 59 | Admitting: Family

## 2015-02-15 ENCOUNTER — Encounter: Payer: Self-pay | Admitting: Family

## 2015-02-15 ENCOUNTER — Ambulatory Visit (HOSPITAL_COMMUNITY)
Admission: RE | Admit: 2015-02-15 | Discharge: 2015-02-15 | Disposition: A | Discharge status: Home / Self Care | Payer: 59 | Source: Ambulatory Visit | Attending: Family | Admitting: Family

## 2015-02-15 VITALS — BP 101/71 | HR 69 | Temp 98.1°F | Resp 16 | Ht 64.0 in | Wt 130.0 lb

## 2015-02-15 DIAGNOSIS — M7989 Other specified soft tissue disorders: Secondary | ICD-10-CM

## 2015-02-15 DIAGNOSIS — M79605 Pain in left leg: Secondary | ICD-10-CM

## 2015-02-15 DIAGNOSIS — I789 Disease of capillaries, unspecified: Secondary | ICD-10-CM

## 2015-02-15 DIAGNOSIS — I781 Nevus, non-neoplastic: Secondary | ICD-10-CM

## 2015-02-15 DIAGNOSIS — G729 Myopathy, unspecified: Secondary | ICD-10-CM | POA: Diagnosis not present

## 2015-02-15 DIAGNOSIS — M6289 Other specified disorders of muscle: Secondary | ICD-10-CM | POA: Insufficient documentation
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
## 2015-12-14 ENCOUNTER — Encounter: Payer: Self-pay | Admitting: Emergency Medicine

## 2015-12-14 ENCOUNTER — Emergency Department
Admission: EM | Admit: 2015-12-14 | Disposition: A | Discharge status: Home / Self Care | Payer: Self-pay | Source: Ambulatory Visit | Attending: Emergency Medicine | Admitting: Emergency Medicine

## 2015-12-14 MED ORDER — IBUPROFEN 400 MG PO TABS *I*
800.0000 mg | ORAL_TABLET | Freq: Once | ORAL | Status: AC
Start: 2015-12-14 — End: 2015-12-14
  Administered 2015-12-14: 800 mg via ORAL
  Filled 2015-12-14: qty 2
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
## 2015-12-14 NOTE — Discharge Instructions (Signed)
You should take Motrin or Tylenol for pain control  Return to the ER immediately if he began to have chest pain, abdominal pain, worsening back pain or neck pain, confusion.  Follow-up with your primary Dr. in one week.    [facility], DEPARTMENT OF EMERGENCY [place] INSTRUCTIONS   ADULT (>16)      A. INSTRUCTIONS FOR AN ADULT WHO WILL WATCH THE PATIENT FOR THE NEXT 24 HOURS    WHAT TO LOOK FOR   IT IS IMPORTANT that the patient be watched closely for the first 24 hours at home. A concussion can cause slow bleeding or swelling of the brain that may not be apparent at first, although after this evaluation we feel this is very unlikely.    AN ADULT SHOULD  1. Stay with and check the patient every 2-4 hours for at least 24 hours, while you are both awake.  2. Call your doctor or return to  the emergency department for the following symptoms  a. Incorrect answers to questions such as: What day is it ? Where are you? Do you remember what happened to you?  b. Unusual behavior, restlessness, combativeness. Difficulty seeing, walking or using arms.  c. Increased sleepiness or drowsiness  d. Vomiting  e. Seizures (Fits or convulsions)  f. Increased headache      B. INSTRUCTIONS FOR THE PATIENT    WHAT TO EXPECT IN THE NEXT DAYS TO WEEKS  Concussions are a common injury, and most people recover fully, usually rapidly in the first few days. If you find you are getting worse, you should see your doctor.  After a concussion you may develop the following symptoms:     HEADACHE- take acetaminophen (Tylenol) or ibuprofen (Advil, Motrin) for headache pain   FATIGUE   DIZZINESS   IRRITABILITY AND EMOTIONAL INSTABILITY    TROUBLE WITH CONCENTRATION AND SHORT TERM MEMORY       RETURNING TO DAILY ACTIVITIES      Limit physical activity as well as activities that require a lot of thinking or concentration. These activities can make symptoms worse and slow your recovery.  o Physical activities include exercising,  running, cycling, weight-training, heavy lifting, etc.   o Thinking activities include anything involving a screen (cell phone, computer, TV, video games), homework, reading, class work, etc.   Get lots of rest. Be sure to get enough sleep at night-no late nights. Keep the same bedtime weekdays and weekends.    Take daytime naps or rest breaks when you feel tired or fatigued.    Avoid alcohol and recreational drugs. These can make symptoms worse and slow your recovery   Drink lots of fluids and eat carbohydrates or protein to main appropriate blood sugar levels.    As symptoms decrease, you may begin to gradually return to your daily activities. If symptoms worsen or return, lessen your activities, then try again to increase your activities gradually.    During recovery, it is normal to feel frustrated and sad when you do not feel right and you cant be as active as usual.    Repeated evaluation of your symptoms is recommended to help guide recovery.         RETURNING TO PE (GYM CLASS) AND SPORTS    A BLOW TO THE HEAD DURING THE TIME WHEN YOUR BRAIN IS RECOVERING FROM CONCUSSION CAN RESULT IN BRAIN INJURY OR, RARELY, DEATH.    You may not participate in PE (gym class) or any sport until  you OBTAIN written clearance from a physician qualified to manage sports-related concussion.
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
## 2016-03-25 ENCOUNTER — Other Ambulatory Visit
Admission: RE | Admit: 2016-03-25 | Discharge: 2016-03-25 | Disposition: A | Discharge status: Home / Self Care | Payer: Self-pay | Source: Ambulatory Visit | Attending: Obstetrics and Gynecology | Admitting: Obstetrics and Gynecology

## 2016-03-25 LAB — BHCG, QUANT PREGNANCY: BHCG, QUANT PREGNANCY: 18228 m[IU]/mL — ABNORMAL HIGH (ref 0–1)

## 2016-04-25 DIAGNOSIS — F419 Anxiety disorder, unspecified: Secondary | ICD-10-CM | POA: Insufficient documentation

## 2016-06-12 ENCOUNTER — Other Ambulatory Visit
Admission: RE | Admit: 2016-06-12 | Discharge: 2016-06-12 | Disposition: A | Discharge status: Home / Self Care | Payer: Self-pay | Source: Ambulatory Visit | Attending: Obstetrics and Gynecology | Admitting: Obstetrics and Gynecology

## 2016-06-12 LAB — BHCG, QUANT PREGNANCY: BHCG, QUANT PREGNANCY: 45366 m[IU]/mL — ABNORMAL HIGH (ref 0–1)

## 2017-07-01 ENCOUNTER — Other Ambulatory Visit
Admission: RE | Admit: 2017-07-01 | Discharge: 2017-07-01 | Disposition: A | Discharge status: Home / Self Care | Payer: Medicaid Other | Source: Ambulatory Visit | Attending: Obstetrics & Gynecology | Admitting: Obstetrics & Gynecology

## 2017-07-01 DIAGNOSIS — Z113 Encounter for screening for infections with a predominantly sexual mode of transmission: Secondary | ICD-10-CM | POA: Insufficient documentation

## 2017-07-01 DIAGNOSIS — Z124 Encounter for screening for malignant neoplasm of cervix: Secondary | ICD-10-CM | POA: Insufficient documentation

## 2017-07-01 DIAGNOSIS — Z0189 Encounter for other specified special examinations: Secondary | ICD-10-CM | POA: Insufficient documentation

## 2017-07-01 DIAGNOSIS — Z01419 Encounter for gynecological examination (general) (routine) without abnormal findings: Secondary | ICD-10-CM | POA: Insufficient documentation

## 2017-07-02 LAB — HEPATITIS C ANTIBODY: Hep C Ab: NEGATIVE

## 2017-07-02 LAB — SYPHILIS SCREEN
Syphilis Screen: NEGATIVE
Syphilis Status: NONREACTIVE

## 2017-07-02 LAB — HSV 2 ANTIBODY, IGG: HSV 2 IgG: 0.6 AI

## 2017-07-02 LAB — N. GONORRHOEAE DNA AMPLIFICATION: N. gonorrhoeae DNA Amplification: 0

## 2017-07-02 LAB — HEPATITIS B SURFACE ANTIGEN: HBV S Ag: NEGATIVE

## 2017-07-02 LAB — CHLAMYDIA PLASMID DNA AMPLIFICATION: Chlamydia Plasmid DNA Amplification: 0

## 2017-07-02 LAB — HSV 1 ANTIBODY, IGG: HSV 1 IgG: <0.2 AI

## 2017-07-02 LAB — HIV 1&2 ANTIGEN/ANTIBODY: HIV 1&2 ANTIGEN/ANTIBODY: NONREACTIVE

## 2017-07-06 LAB — GYN CYTOLOGY

## 2017-08-11 ENCOUNTER — Other Ambulatory Visit
Admission: RE | Admit: 2017-08-11 | Discharge: 2017-08-11 | Disposition: A | Discharge status: Home / Self Care | Payer: Medicaid Other | Source: Ambulatory Visit | Attending: Pathology | Admitting: Pathology

## 2017-08-11 DIAGNOSIS — D280 Benign neoplasm of vulva: Secondary | ICD-10-CM | POA: Insufficient documentation

## 2017-08-23 LAB — SURGICAL PATHOLOGY

## 2017-09-03 ENCOUNTER — Other Ambulatory Visit
Admission: RE | Admit: 2017-09-03 | Discharge: 2017-09-03 | Disposition: A | Discharge status: Home / Self Care | Payer: Medicaid Other | Source: Ambulatory Visit | Attending: Obstetrics | Admitting: Obstetrics

## 2017-09-03 DIAGNOSIS — N760 Acute vaginitis: Secondary | ICD-10-CM | POA: Insufficient documentation

## 2017-09-04 LAB — VAGINITIS SCREEN: DNA PROBE
Vaginitis Screen:DNA Probe: POSITIVE — AB
Vaginitis Screen:DNA Probe: POSITIVE — AB

## 2017-11-23 ENCOUNTER — Ambulatory Visit
Admission: AD | Admit: 2017-11-23 | Discharge: 2017-11-23 | Disposition: A | Discharge status: Home / Self Care | Payer: Medicaid Other | Source: Ambulatory Visit | Attending: Emergency Medicine | Admitting: Emergency Medicine

## 2017-11-23 DIAGNOSIS — H9201 Otalgia, right ear: Secondary | ICD-10-CM

## 2017-11-23 MED ORDER — FLUTICASONE PROPIONATE 50 MCG/ACT NA SUSP *I*
1.0000 | Freq: Every day | NASAL | 0 refills | Status: DC
Start: 2017-11-23 — End: 2021-07-24

## 2017-11-23 NOTE — ED Triage Notes (Signed)
C/o right ear, hurts when she swallows and talks.        Triage Note   Loura Pardon, RN
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
## 2017-11-23 NOTE — Discharge Instructions (Signed)
Fluids  Motrin, tylenol as needed  Flonase as directed  Sudafed as needed for congestion  Zyrtec as directed daily  Call your PCP for worsening pain, fever, change in hearing.

## 2018-01-03 LAB — OB RESULTS CONSOLE RPR: RPR: NONREACTIVE

## 2018-01-03 LAB — OB RESULTS CONSOLE RUBELLA ANTIBODY, IGM: RUBELLA: IMMUNE

## 2018-01-03 LAB — OB RESULTS CONSOLE HEPATITIS B SURFACE ANTIGEN: Hepatitis B Surface Ag: NEGATIVE

## 2018-01-03 LAB — OB RESULTS CONSOLE GC/CHLAMYDIA
CHLAMYDIA, DNA PROBE: NEGATIVE
Gonorrhea: NEGATIVE

## 2018-01-03 LAB — OB RESULTS CONSOLE ANTIBODY SCREEN: Antibody Screen: NEGATIVE

## 2018-01-03 LAB — OB RESULTS CONSOLE ABO/RH: RH Type: POSITIVE

## 2018-01-03 LAB — OB RESULTS CONSOLE HIV ANTIBODY (ROUTINE TESTING): HIV: NONREACTIVE

## 2018-01-07 ENCOUNTER — Encounter (HOSPITAL_COMMUNITY): Payer: Self-pay | Admitting: *Deleted

## 2018-01-11 ENCOUNTER — Encounter (HOSPITAL_COMMUNITY): Payer: Self-pay

## 2018-01-11 ENCOUNTER — Ambulatory Visit (HOSPITAL_COMMUNITY)
Admission: RE | Admit: 2018-01-11 | Discharge: 2018-01-11 | Disposition: A | Discharge status: Home / Self Care | Payer: BC Managed Care – PPO | Source: Ambulatory Visit | Attending: Obstetrics & Gynecology | Admitting: Obstetrics & Gynecology

## 2018-01-11 ENCOUNTER — Other Ambulatory Visit: Payer: Self-pay

## 2018-01-11 DIAGNOSIS — Z3A09 9 weeks gestation of pregnancy: Secondary | ICD-10-CM | POA: Diagnosis not present

## 2018-01-11 DIAGNOSIS — O360910 Maternal care for other rhesus isoimmunization, first trimester, not applicable or unspecified: Secondary | ICD-10-CM | POA: Insufficient documentation

## 2018-01-11 HISTORY — DX: Unspecified abnormal cytological findings in specimens from vagina: R87.629

## 2018-01-11 NOTE — Consult Note (Signed)
Maternal Fetal Medicine Consultation  I had the pleasure of seeing your patient Mackenzie [facility] in [facility] on 01/11/2018. As you know, Mackenzie Howard is a 37 y.o. G3P1011 at [redacted]w[redacted]d who presents for consultation regarding anti-E antibodies. She had new OB labs done on 01/03/18 noting a positive antibody screen. 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  Rogene has an obstetric history that includes one term vaginal delivery and one spontaneous abortion at 5-6 weeks. She denies any issues with antepartum bleeding. She denies any history of blood transfusions or IV drug use. Her prior antibody screens were negative to her knowledge. Mackenzie Howard reports no issues with jaundice or anemia in her prior child. Her current pregnancy is with the same partner, [name] (DOB 12/29/81).  Laura-Lee has no additional past medical or surgical history. She takes prenatal vitamins and Bonjesta and is allergic to no medications. Destyne denies alcohol, tobacco or other drug use.  Her family history is non-contributory.  We discussed the following issues during her visit today:  Anti-E antibodies: We outlined the etiology and implications of anti-E antibodies in pregnancy and the risks of hemolytic disease of the fetus and newborn. We reviewed that maternal anti-E antibodies can cross the placenta and bind to fetal red blood cells and lead to hemolysis and potentially severe fetal anemia. Untreated severe anemia can lead to fetal hydrops and death. This only occurs if the fetal blood expresses the E antigen and if maternal titers are high enough. Given Mackenzie Howard's clinical history, I suspect that she was sensitized in pregnancy and that her partner is E antigen positive. I took the liberty of ordering paternal blood antigen testing today. If paternal typing notes homozygocity for the E antigen, or if paternal heterozygocity is noted and amniocentesis confirms  the fetus expresses the E antigen, the fetus could be at risk. The risk for fetal anemia is very low with titers less than 1:16. When titers are less than 1:16 we recommend titer levels be assessed every 4 weeks (2 if increasing trend in levels) throughout the pregnancy. If they continue to be lower than 1:16, no further monitoring or evaluation is needed. If at any point her titers are 1:16 or greater, further fetal surveillance is required. We briefly discussed that surveillance would include weekly fetal MCA Dopplers to screen for fetal anemia. If markedly elevated peak systolic velocities on MCA Dopplers are noted and confirmed, confirmation of fetal anemia via a PUBS procedure and intrauterine transfusion is considered.   We discussed that maternal antibodies persist in the newborn for months, and that babies require surveillance for evidence of hemolysis and anemia. Jaundice and the need for transfusions in the first months of life are not uncommon. We also addressed the fact that hemolytic disease of the fetus and newborn can be more severe with subsequent pregnancies. Maternal antibodies can also make it more difficult to crossmatch blood. I would recommend ordering a bloodtype and crossmatch when Mackenzie Howard is admitted for delivery so that blood can be available if needed.  At the conclusion of our visit, [name] seemed to have a good understanding of the issues addressed above. Thank you for involving [name] in [name]'s pregnancy care. I will await the result of her partner's antigen testing. No further follow-up is planned, but would be happy to see her back as indicated by a titer of 1:16 or greater, if she desires amniocentesis, or if other issues arise. If you have  any questions or concerns regarding my assessment or the recommendations outlined above, please feel free to contact me.  I spent approximately 40 minutes with this patient with over 50% of time spent in face-to-face  counseling.  Darlyn ReadEmily Bunce, MD Maternal-Fetal Medicine

## 2018-01-14 ENCOUNTER — Other Ambulatory Visit
Admission: RE | Admit: 2018-01-14 | Discharge: 2018-01-14 | Disposition: A | Discharge status: Home / Self Care | Payer: Medicaid Other | Source: Ambulatory Visit | Attending: Obstetrics & Gynecology | Admitting: Obstetrics & Gynecology

## 2018-01-14 DIAGNOSIS — N760 Acute vaginitis: Secondary | ICD-10-CM | POA: Insufficient documentation

## 2018-01-14 DIAGNOSIS — Z113 Encounter for screening for infections with a predominantly sexual mode of transmission: Secondary | ICD-10-CM | POA: Insufficient documentation

## 2018-01-14 LAB — CHLAMYDIA PLASMID DNA AMPLIFICATION: Chlamydia Plasmid DNA Amplification: 0

## 2018-01-14 LAB — N. GONORRHOEAE DNA AMPLIFICATION: N. gonorrhoeae DNA Amplification: 0

## 2018-01-15 LAB — VAGINITIS SCREEN: DNA PROBE
Vaginitis Screen:DNA Probe: POSITIVE — AB
Vaginitis Screen:DNA Probe: POSITIVE — AB

## 2018-01-18 ENCOUNTER — Telehealth (HOSPITAL_COMMUNITY): Payer: Self-pay | Admitting: *Deleted

## 2018-01-19 ENCOUNTER — Telehealth (HOSPITAL_COMMUNITY): Payer: Self-pay | Admitting: *Deleted

## 2018-01-19 NOTE — Telephone Encounter (Signed)
Pt returned call to RN for FOB blood genotyping results.  Results rev'd by Dr. Archie PattenBunce.  Results given, explained that pt will need titers every 4 weeks, if titers are greater than 1:16, pt will need MCA dopplers weekly.  Pt voiced understanding, all questions answered.  Consult note and result faxed to [facility].

## 2018-02-08 ENCOUNTER — Encounter (HOSPITAL_COMMUNITY): Payer: Self-pay | Admitting: Obstetrics & Gynecology

## 2018-03-15 ENCOUNTER — Encounter (HOSPITAL_COMMUNITY): Payer: Self-pay | Admitting: Obstetrics & Gynecology

## 2018-06-13 ENCOUNTER — Other Ambulatory Visit (HOSPITAL_COMMUNITY): Payer: Self-pay | Admitting: *Deleted

## 2018-06-14 ENCOUNTER — Ambulatory Visit (HOSPITAL_COMMUNITY)
Admission: RE | Admit: 2018-06-14 | Discharge: 2018-06-14 | Disposition: A | Discharge status: Home / Self Care | Payer: BC Managed Care – PPO | Source: Ambulatory Visit | Attending: Obstetrics & Gynecology | Admitting: Obstetrics & Gynecology

## 2018-06-14 DIAGNOSIS — O99019 Anemia complicating pregnancy, unspecified trimester: Secondary | ICD-10-CM | POA: Insufficient documentation

## 2018-06-14 DIAGNOSIS — D509 Iron deficiency anemia, unspecified: Secondary | ICD-10-CM | POA: Insufficient documentation

## 2018-06-14 MED ORDER — SODIUM CHLORIDE 0.9 % IV SOLN
510.0000 mg | INTRAVENOUS | Status: DC
  Administered 2018-06-14: 510 mg via INTRAVENOUS
  Filled 2018-06-14: qty 596.7

## 2018-06-14 NOTE — Discharge Instructions (Signed)

## 2018-06-17 ENCOUNTER — Ambulatory Visit (HOSPITAL_COMMUNITY)
Admission: RE | Admit: 2018-06-17 | Discharge: 2018-06-17 | Disposition: A | Discharge status: Home / Self Care | Payer: BC Managed Care – PPO | Source: Ambulatory Visit | Attending: Obstetrics & Gynecology | Admitting: Obstetrics & Gynecology

## 2018-06-17 DIAGNOSIS — A509 Congenital syphilis, unspecified: Secondary | ICD-10-CM | POA: Insufficient documentation

## 2018-06-17 MED ORDER — FERUMOXYTOL INJECTION 510 MG/17 ML
510.0000 mg | Freq: Once | INTRAVENOUS | Status: AC
  Administered 2018-06-17: 510 mg via INTRAVENOUS
  Filled 2018-06-17: qty 17

## 2018-07-04 ENCOUNTER — Other Ambulatory Visit (HOSPITAL_COMMUNITY): Payer: Self-pay | Admitting: Maternal and Fetal Medicine

## 2018-07-04 ENCOUNTER — Other Ambulatory Visit (HOSPITAL_COMMUNITY): Payer: Self-pay | Admitting: Obstetrics and Gynecology

## 2018-07-04 ENCOUNTER — Other Ambulatory Visit (HOSPITAL_COMMUNITY): Payer: Self-pay | Admitting: Obstetrics & Gynecology

## 2018-07-04 ENCOUNTER — Encounter (HOSPITAL_COMMUNITY): Payer: Self-pay

## 2018-07-04 ENCOUNTER — Ambulatory Visit (HOSPITAL_COMMUNITY)
Admission: RE | Admit: 2018-07-04 | Discharge: 2018-07-04 | Disposition: A | Discharge status: Home / Self Care | Payer: BC Managed Care – PPO | Source: Ambulatory Visit | Attending: Family Medicine | Admitting: Family Medicine

## 2018-07-04 ENCOUNTER — Encounter (HOSPITAL_COMMUNITY): Payer: Self-pay | Admitting: Obstetrics & Gynecology

## 2018-07-04 DIAGNOSIS — Z363 Encounter for antenatal screening for malformations: Secondary | ICD-10-CM | POA: Insufficient documentation

## 2018-07-04 DIAGNOSIS — Z3A34 34 weeks gestation of pregnancy: Secondary | ICD-10-CM | POA: Diagnosis not present

## 2018-07-04 DIAGNOSIS — O321XX0 Maternal care for breech presentation, not applicable or unspecified: Secondary | ICD-10-CM | POA: Diagnosis not present

## 2018-07-04 DIAGNOSIS — O09523 Supervision of elderly multigravida, third trimester: Secondary | ICD-10-CM | POA: Diagnosis not present

## 2018-07-04 DIAGNOSIS — O360930 Maternal care for other rhesus isoimmunization, third trimester, not applicable or unspecified: Secondary | ICD-10-CM | POA: Diagnosis not present

## 2018-07-04 NOTE — Consult Note (Signed)
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 Her husband and her mother where present for the duration of this consultation.  She is a [age] year old G3P1011 at 334 weeks and 3 days gestational age.   Anti E antibody titer increase from 1:2, 3 months ago and to 1:32 on 06/30/2018. She denies history of blood transfusion and all her pregnancies have been fathered by the same individual. I am informed by this couple that her husband is heterozygous for Rhesus E antigen.  Past Obstetric History: 2014- Term uncomplicated vaginal delivery  2018- Spontaneous abortion of first trimester pregnancy  Past Medical History: None  Past Surgical History: None  Social History: Denies smoking, use of illicit drugs and or intake of alcohol. 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  Allergy History: No food and or drug allergy   ROS: 10 system review normal except for reproductive system  Examination: Oriented in time place person  Normal vital signs  Ultrasound performed today revealed a live fetus in breech presentation. Normal fetal structures within limitations of obstetric ultrasound. Ultrasound biophysical profile score is 8/8. No evidence of fetal hydrops. MCA Doppler velocimetry revealed no evidence of fetal anemia  Discussion counseling and recommended plan of care We discussed in detail the implications of isoimmunization. Big E antigen IgG can cross the placenta and cause fetal red blood cells hemolysis. We discussed that her anti big E IgG titer levels reflect the degree of hemolysis. A titer at 1:32 increases risk for fetal hemolytic disease. Therefore continued surveillance is needed despite normal findings today. The following is therefore recommended: - Continue [facility] on Thursdays. - Weekly BPP and MCA Doppler on Mondays. - Delivery at [redacted] weeks gestational age at this  location. NICU consultation on admission to L&D. - For future pregnancies cell free fetal DNA testing or amniocentesis should be performed to determine fetal status and pregnancy planning since her partner is heterozygous for this antigen.  [name] verbalized understanding of our discussion and recommended plan of care as documented.  I spent ~40 minutes in face-to-face time with this family.  Blase MessHenry Korver Graybeal MD MFM

## 2018-07-05 ENCOUNTER — Other Ambulatory Visit (HOSPITAL_COMMUNITY): Payer: Self-pay | Admitting: *Deleted

## 2018-07-05 DIAGNOSIS — O360930 Maternal care for other rhesus isoimmunization, third trimester, not applicable or unspecified: Secondary | ICD-10-CM

## 2018-07-11 ENCOUNTER — Telehealth (HOSPITAL_COMMUNITY): Payer: Self-pay | Admitting: *Deleted

## 2018-07-11 ENCOUNTER — Encounter (HOSPITAL_COMMUNITY): Payer: Self-pay

## 2018-07-11 ENCOUNTER — Ambulatory Visit (HOSPITAL_COMMUNITY)
Admission: RE | Admit: 2018-07-11 | Discharge: 2018-07-11 | Disposition: A | Discharge status: Home / Self Care | Payer: BC Managed Care – PPO | Source: Ambulatory Visit | Attending: Family Medicine | Admitting: Family Medicine

## 2018-07-11 ENCOUNTER — Other Ambulatory Visit (HOSPITAL_COMMUNITY): Payer: BC Managed Care – PPO

## 2018-07-11 DIAGNOSIS — O360930 Maternal care for other rhesus isoimmunization, third trimester, not applicable or unspecified: Secondary | ICD-10-CM | POA: Diagnosis not present

## 2018-07-11 DIAGNOSIS — O09523 Supervision of elderly multigravida, third trimester: Secondary | ICD-10-CM | POA: Diagnosis not present

## 2018-07-11 DIAGNOSIS — Z3A35 35 weeks gestation of pregnancy: Secondary | ICD-10-CM | POA: Diagnosis not present

## 2018-07-11 NOTE — H&P (Signed)
NAME: Mackenzie Howard, Mackenzie Howard MEDICAL RECORD [id number] ACCOUNT [id number] DATE OF BIRTH:September 02, 1980 FACILITY: [facility] LOCATION:  PHYSICIAN:Kayci Belleville Milana ObeyM. Jayen Bromwell, MD  HISTORY AND PHYSICAL  DATE OF ADMISSION:  07/24/2018  CHIEF COMPLAINT:  Cesarean section, breech presentation, anti-E antibody.  HISTORY OF PRESENT ILLNESS:  A 37 year old G3, P1-0-1-1 at 37 weeks with breech presentation and a rising anti-E titer.  She has been seen by MFM who is performing weekly ultrasounds creams with BPP and weekly nonstress tests, all of which has been  normal.  Her anti-E titers have been relatively low until late in the pregnancy when it was elevated at 1:32.  She presents at this time for primary CS for breech presentation and anti-E disease.  The procedure including the specific risks regarding  bleeding, infection, wound infection, phlebitis, transfusion all reviewed with her, which she understands and accepts.  Blood type is O positive.  Her panorama screen was normal.  ALLERGIES:  None.  OBSTETRICAL HISTORY:  One vaginal delivery 2014 at term.    For the remainder of her family and social history, please see the [place] form for details.  PHYSICAL EXAMINATION: VITAL SIGNS:  Temperature 98.2, blood pressure 120/60. HEENT:  Unremarkable. NECK:  Supple, without masses. LUNGS:  Clear. CARDIOVASCULAR:  Regular rate and rhythm without murmurs, rubs or gallops. BREASTS:  Not examined.  Term fundal height.  Fetal heart rate 140.  Breech by Leopold's.  Cervix was closed. EXTREMITIES:  Unremarkable. NEUROLOGIC:  Unremarkable.  IMPRESSION: 1.  A 37 intrauterine pregnancy. 2.  Breech presentation. 3.  Anti-E titer, rising.  PLAN:  Primary cesarean section.  Procedure and risks reviewed as above  AN/NUANCE  D:07/11/2018 T:07/11/2018 JOB:[id number]

## 2018-07-11 NOTE — Telephone Encounter (Signed)
Preadmission screen  

## 2018-07-12 ENCOUNTER — Encounter (HOSPITAL_COMMUNITY): Payer: Self-pay

## 2018-07-18 ENCOUNTER — Encounter (HOSPITAL_COMMUNITY): Payer: Self-pay

## 2018-07-18 ENCOUNTER — Other Ambulatory Visit (HOSPITAL_COMMUNITY): Payer: BC Managed Care – PPO

## 2018-07-18 ENCOUNTER — Ambulatory Visit (HOSPITAL_COMMUNITY)
Admission: RE | Admit: 2018-07-18 | Discharge: 2018-07-18 | Disposition: A | Discharge status: Home / Self Care | Payer: BC Managed Care – PPO | Source: Ambulatory Visit | Attending: Family Medicine | Admitting: Family Medicine

## 2018-07-18 DIAGNOSIS — O360930 Maternal care for other rhesus isoimmunization, third trimester, not applicable or unspecified: Secondary | ICD-10-CM | POA: Diagnosis not present

## 2018-07-18 DIAGNOSIS — Z3A36 36 weeks gestation of pregnancy: Secondary | ICD-10-CM | POA: Diagnosis not present

## 2018-07-18 DIAGNOSIS — O09523 Supervision of elderly multigravida, third trimester: Secondary | ICD-10-CM

## 2018-07-21 NOTE — Patient Instructions (Signed)
Mackenzie Howard  07/21/2018   Your procedure is scheduled on:  07/24/2018  Enter through the [facility] of [facility] at 0930 AM.  Pick up the phone at the desk and dial [phone]  Call this number if you have problems the morning of [phone]  Remember:   Do not eat food:(After Midnight) Desps de medianoche.  Do not drink clear liquids: (After Midnight) Desps de medianoche.  Take these medicines the morning of surgery with A SIP OF WATER: none   Do not wear jewelry, make-up or nail polish.  Do not wear lotions, powders, or perfumes. Do not wear deodorant.  Do not shave 48 hours prior to surgery.  Do not bring valuables to the hospital.  [facility] is not   responsible for any belongings or valuables brought to the hospital.  Contacts, dentures or bridgework may not be worn into surgery.  Leave suitcase in the car. After surgery it may be brought to your room.  For patients admitted to the hospital, checkout time is 11:00 AM the day of              discharge.    N/A   Please read over the following fact sheets that you were given:   Surgical Site Infection Prevention

## 2018-07-22 ENCOUNTER — Encounter (HOSPITAL_COMMUNITY)
Admission: RE | Admit: 2018-07-22 | Discharge: 2018-07-22 | Disposition: A | Discharge status: Home / Self Care | Payer: BC Managed Care – PPO | Source: Ambulatory Visit | Attending: Obstetrics and Gynecology | Admitting: Obstetrics and Gynecology

## 2018-07-22 LAB — CBC
HCT: 35.7 % — ABNORMAL LOW (ref 36.0–46.0)
Hemoglobin: 11.4 g/dL — ABNORMAL LOW (ref 12.0–15.0)
MCH: 30.5 pg (ref 26.0–34.0)
MCHC: 31.9 g/dL (ref 30.0–36.0)
MCV: 95.5 fL (ref 80.0–100.0)
PLATELETS: 209 10*3/uL (ref 150–400)
RBC: 3.74 MIL/uL — ABNORMAL LOW (ref 3.87–5.11)
RDW: 17.5 % — AB (ref 11.5–15.5)
WBC: 6.7 10*3/uL (ref 4.0–10.5)
nRBC: 0 % (ref 0.0–0.2)

## 2018-07-22 LAB — RAPID HIV SCREEN (HIV 1/2 AB+AG)
HIV 1/2 Antibodies: NONREACTIVE
HIV-1 P24 Antigen - HIV24: NONREACTIVE

## 2018-07-23 LAB — RPR: RPR Ser Ql: NONREACTIVE

## 2018-07-24 ENCOUNTER — Inpatient Hospital Stay (HOSPITAL_COMMUNITY): Payer: BC Managed Care – PPO | Admitting: Certified Registered Nurse Anesthetist

## 2018-07-24 ENCOUNTER — Encounter (HOSPITAL_COMMUNITY): Payer: Self-pay | Admitting: *Deleted

## 2018-07-24 ENCOUNTER — Inpatient Hospital Stay (HOSPITAL_COMMUNITY)
Admission: RE | Admit: 2018-07-24 | Discharge: 2018-07-26 | DRG: 788 | Disposition: A | Discharge status: Home / Self Care | Payer: BC Managed Care – PPO | Attending: Obstetrics and Gynecology | Admitting: Obstetrics and Gynecology

## 2018-07-24 ENCOUNTER — Encounter (HOSPITAL_COMMUNITY)
Admission: RE | Disposition: A | Discharge status: Home / Self Care | Payer: Self-pay | Source: Home / Self Care | Attending: Obstetrics and Gynecology

## 2018-07-24 DIAGNOSIS — O321XX0 Maternal care for breech presentation, not applicable or unspecified: Secondary | ICD-10-CM | POA: Diagnosis present

## 2018-07-24 DIAGNOSIS — Z3A37 37 weeks gestation of pregnancy: Secondary | ICD-10-CM

## 2018-07-24 SURGERY — Surgical Case
Anesthesia: General | Site: Abdomen | Wound class: Clean Contaminated

## 2018-07-24 MED ORDER — SODIUM CHLORIDE 0.9 % IV SOLN
2.0000 g | INTRAVENOUS | Status: AC
  Administered 2018-07-24: 2 g via INTRAVENOUS
  Filled 2018-07-24: qty 2

## 2018-07-24 MED ORDER — KETOROLAC TROMETHAMINE 30 MG/ML IJ SOLN: 30.0000 mg | Freq: Four times a day (QID) | INTRAMUSCULAR | Status: AC | PRN

## 2018-07-24 MED ORDER — MEASLES, MUMPS & RUBELLA VAC IJ SOLR
0.5000 mL | Freq: Once | INTRAMUSCULAR | Status: DC
  Filled 2018-07-24: qty 0.5

## 2018-07-24 MED ORDER — ACETAMINOPHEN 500 MG PO TABS: 1000.0000 mg | ORAL_TABLET | Freq: Four times a day (QID) | ORAL | Status: DC

## 2018-07-24 MED ORDER — TERBUTALINE SULFATE 1 MG/ML IJ SOLN
INTRAMUSCULAR | Status: AC
  Filled 2018-07-24: qty 1

## 2018-07-24 MED ORDER — LACTATED RINGERS IV SOLN
INTRAVENOUS | Status: DC | PRN
  Administered 2018-07-24: 11:00:00 via INTRAVENOUS

## 2018-07-24 MED ORDER — PHENYLEPHRINE 8 MG IN D5W 100 ML (0.08MG/ML) PREMIX OPTIME
INJECTION | INTRAVENOUS | Status: AC
  Filled 2018-07-24: qty 100

## 2018-07-24 MED ORDER — NALOXONE HCL 0.4 MG/ML IJ SOLN: 0.4000 mg | INTRAMUSCULAR | Status: DC | PRN

## 2018-07-24 MED ORDER — DIPHENHYDRAMINE HCL 25 MG PO CAPS: 25.0000 mg | ORAL_CAPSULE | ORAL | Status: DC | PRN

## 2018-07-24 MED ORDER — DEXTROSE IN LACTATED RINGERS 5 % IV SOLN: INTRAVENOUS | Status: DC

## 2018-07-24 MED ORDER — DEXAMETHASONE SODIUM PHOSPHATE 10 MG/ML IJ SOLN
INTRAMUSCULAR | Status: AC
  Filled 2018-07-24: qty 1

## 2018-07-24 MED ORDER — SODIUM CHLORIDE 0.9% FLUSH: 3.0000 mL | INTRAVENOUS | Status: DC | PRN

## 2018-07-24 MED ORDER — MORPHINE SULFATE (PF) 0.5 MG/ML IJ SOLN
INTRAMUSCULAR | Status: AC
  Filled 2018-07-24: qty 10

## 2018-07-24 MED ORDER — ONDANSETRON HCL 4 MG/2ML IJ SOLN
INTRAMUSCULAR | Status: AC
  Filled 2018-07-24: qty 2

## 2018-07-24 MED ORDER — DIPHENHYDRAMINE HCL 25 MG PO CAPS: 25.0000 mg | ORAL_CAPSULE | Freq: Four times a day (QID) | ORAL | Status: DC | PRN

## 2018-07-24 MED ORDER — FLEET ENEMA 7-19 GM/118ML RE ENEM: 1.0000 | ENEMA | Freq: Every day | RECTAL | Status: DC | PRN

## 2018-07-24 MED ORDER — DIBUCAINE 1 % RE OINT: 1.0000 "application " | TOPICAL_OINTMENT | RECTAL | Status: DC | PRN

## 2018-07-24 MED ORDER — NALOXONE HCL 4 MG/10ML IJ SOLN
1.0000 ug/kg/h | INTRAVENOUS | Status: DC | PRN
  Filled 2018-07-24: qty 5

## 2018-07-24 MED ORDER — SIMETHICONE 80 MG PO CHEW
80.0000 mg | CHEWABLE_TABLET | ORAL | Status: DC
  Administered 2018-07-24 – 2018-07-25 (×2): 80 mg via ORAL
  Filled 2018-07-24 (×2): qty 1

## 2018-07-24 MED ORDER — MEPERIDINE HCL 25 MG/ML IJ SOLN: 6.2500 mg | INTRAMUSCULAR | Status: DC | PRN

## 2018-07-24 MED ORDER — SENNOSIDES-DOCUSATE SODIUM 8.6-50 MG PO TABS
2.0000 | ORAL_TABLET | ORAL | Status: DC
  Administered 2018-07-24 – 2018-07-25 (×2): 2 via ORAL
  Filled 2018-07-24 (×2): qty 2

## 2018-07-24 MED ORDER — OXYTOCIN 10 UNIT/ML IJ SOLN
INTRAMUSCULAR | Status: AC
  Filled 2018-07-24: qty 4

## 2018-07-24 MED ORDER — FENTANYL CITRATE (PF) 100 MCG/2ML IJ SOLN
INTRAMUSCULAR | Status: AC
  Filled 2018-07-24: qty 2

## 2018-07-24 MED ORDER — SCOPOLAMINE 1 MG/3DAYS TD PT72
1.0000 | MEDICATED_PATCH | Freq: Once | TRANSDERMAL | Status: DC
  Filled 2018-07-24: qty 1

## 2018-07-24 MED ORDER — DIPHENHYDRAMINE HCL 50 MG/ML IJ SOLN: 12.5000 mg | INTRAMUSCULAR | Status: DC | PRN

## 2018-07-24 MED ORDER — SODIUM CHLORIDE 0.9 % IV SOLN: 250.0000 mL | INTRAVENOUS | Status: DC

## 2018-07-24 MED ORDER — NALBUPHINE HCL 10 MG/ML IJ SOLN: 5.0000 mg | Freq: Once | INTRAMUSCULAR | Status: DC | PRN

## 2018-07-24 MED ORDER — DEXTROSE IN LACTATED RINGERS 5 % IV SOLN
INTRAVENOUS | Status: DC
  Administered 2018-07-24: 22:00:00 via INTRAVENOUS

## 2018-07-24 MED ORDER — FENTANYL CITRATE (PF) 100 MCG/2ML IJ SOLN: 25.0000 ug | INTRAMUSCULAR | Status: DC | PRN

## 2018-07-24 MED ORDER — BISACODYL 10 MG RE SUPP: 10.0000 mg | Freq: Every day | RECTAL | Status: DC | PRN

## 2018-07-24 MED ORDER — DEXAMETHASONE SODIUM PHOSPHATE 4 MG/ML IJ SOLN
INTRAMUSCULAR | Status: DC | PRN
  Administered 2018-07-24: 4 mg via INTRAVENOUS

## 2018-07-24 MED ORDER — ONDANSETRON HCL 4 MG/2ML IJ SOLN
INTRAMUSCULAR | Status: DC | PRN
  Administered 2018-07-24: 4 mg via INTRAVENOUS

## 2018-07-24 MED ORDER — METOCLOPRAMIDE HCL 5 MG/ML IJ SOLN: 10.0000 mg | Freq: Once | INTRAMUSCULAR | Status: DC | PRN

## 2018-07-24 MED ORDER — ACETAMINOPHEN 500 MG PO TABS
1000.0000 mg | ORAL_TABLET | Freq: Four times a day (QID) | ORAL | Status: DC
  Administered 2018-07-24 – 2018-07-26 (×7): 1000 mg via ORAL
  Filled 2018-07-24 (×7): qty 2

## 2018-07-24 MED ORDER — OXYTOCIN 40 UNITS IN LACTATED RINGERS INFUSION - SIMPLE MED: 2.5000 [IU]/h | INTRAVENOUS | Status: AC

## 2018-07-24 MED ORDER — NALBUPHINE HCL 10 MG/ML IJ SOLN: 5.0000 mg | INTRAMUSCULAR | Status: DC | PRN

## 2018-07-24 MED ORDER — BUPIVACAINE IN DEXTROSE 0.75-8.25 % IT SOLN
INTRATHECAL | Status: DC | PRN
  Administered 2018-07-24: 1.4 mL via INTRATHECAL

## 2018-07-24 MED ORDER — MENTHOL 3 MG MT LOZG: 1.0000 | LOZENGE | OROMUCOSAL | Status: DC | PRN

## 2018-07-24 MED ORDER — LACTATED RINGERS IV SOLN
INTRAVENOUS | Status: DC
  Administered 2018-07-24 (×3): via INTRAVENOUS

## 2018-07-24 MED ORDER — KETOROLAC TROMETHAMINE 30 MG/ML IJ SOLN
30.0000 mg | Freq: Four times a day (QID) | INTRAMUSCULAR | Status: AC | PRN
  Administered 2018-07-24 – 2018-07-25 (×2): 30 mg via INTRAVENOUS
  Filled 2018-07-24 (×2): qty 1

## 2018-07-24 MED ORDER — SIMETHICONE 80 MG PO CHEW
80.0000 mg | CHEWABLE_TABLET | Freq: Three times a day (TID) | ORAL | Status: DC
  Administered 2018-07-24 – 2018-07-25 (×4): 80 mg via ORAL
  Filled 2018-07-24 (×5): qty 1

## 2018-07-24 MED ORDER — MORPHINE SULFATE (PF) 0.5 MG/ML IJ SOLN
INTRAMUSCULAR | Status: DC | PRN
  Administered 2018-07-24: .15 mg via INTRATHECAL

## 2018-07-24 MED ORDER — COCONUT OIL OIL
1.0000 "application " | TOPICAL_OIL | Status: DC | PRN
  Filled 2018-07-24: qty 120

## 2018-07-24 MED ORDER — TETANUS-DIPHTH-ACELL PERTUSSIS 5-2.5-18.5 LF-MCG/0.5 IM SUSP: 0.5000 mL | Freq: Once | INTRAMUSCULAR | Status: DC

## 2018-07-24 MED ORDER — LACTATED RINGERS IV SOLN: INTRAVENOUS | Status: DC

## 2018-07-24 MED ORDER — WITCH HAZEL-GLYCERIN EX PADS: 1.0000 "application " | MEDICATED_PAD | CUTANEOUS | Status: DC | PRN

## 2018-07-24 MED ORDER — PHENYLEPHRINE 8 MG IN D5W 100 ML (0.08MG/ML) PREMIX OPTIME
INJECTION | INTRAVENOUS | Status: DC | PRN
  Administered 2018-07-24: 60 ug/min via INTRAVENOUS

## 2018-07-24 MED ORDER — SIMETHICONE 80 MG PO CHEW: 80.0000 mg | CHEWABLE_TABLET | ORAL | Status: DC | PRN

## 2018-07-24 MED ORDER — PRENATAL MULTIVITAMIN CH
1.0000 | ORAL_TABLET | Freq: Every day | ORAL | Status: DC
  Administered 2018-07-25 – 2018-07-26 (×2): 1 via ORAL
  Filled 2018-07-24 (×2): qty 1

## 2018-07-24 MED ORDER — OXYCODONE-ACETAMINOPHEN 5-325 MG PO TABS: 1.0000 | ORAL_TABLET | ORAL | Status: DC | PRN

## 2018-07-24 MED ORDER — TERBUTALINE SULFATE 1 MG/ML IJ SOLN
0.2500 mg | Freq: Once | INTRAMUSCULAR | Status: AC
  Administered 2018-07-24: 0.25 mg via SUBCUTANEOUS

## 2018-07-24 MED ORDER — ONDANSETRON HCL 4 MG/2ML IJ SOLN: 4.0000 mg | Freq: Three times a day (TID) | INTRAMUSCULAR | Status: DC | PRN

## 2018-07-24 MED ORDER — FENTANYL CITRATE (PF) 100 MCG/2ML IJ SOLN
INTRAMUSCULAR | Status: DC | PRN
  Administered 2018-07-24: 15 ug via INTRATHECAL

## 2018-07-24 MED ORDER — SCOPOLAMINE 1 MG/3DAYS TD PT72
MEDICATED_PATCH | TRANSDERMAL | Status: AC
  Filled 2018-07-24: qty 1

## 2018-07-24 MED ORDER — SCOPOLAMINE 1 MG/3DAYS TD PT72
MEDICATED_PATCH | TRANSDERMAL | Status: DC | PRN
  Administered 2018-07-24: 1 via TRANSDERMAL

## 2018-07-24 MED ORDER — SODIUM CHLORIDE 0.9% FLUSH: 3.0000 mL | Freq: Two times a day (BID) | INTRAVENOUS | Status: DC

## 2018-07-24 MED ORDER — ZOLPIDEM TARTRATE 5 MG PO TABS: 5.0000 mg | ORAL_TABLET | Freq: Every evening | ORAL | Status: DC | PRN

## 2018-07-24 MED ORDER — OXYTOCIN 10 UNIT/ML IJ SOLN
INTRAVENOUS | Status: DC | PRN
  Administered 2018-07-24: 40 [IU] via INTRAVENOUS

## 2018-07-24 SURGICAL SUPPLY — 29 items
BENZOIN TINCTURE PRP APPL 2/3 (GAUZE/BANDAGES/DRESSINGS) ×3 IMPLANT
CHLORAPREP W/TINT 26ML (MISCELLANEOUS) ×3 IMPLANT
CLAMP CORD UMBIL (MISCELLANEOUS) IMPLANT
CLOSURE WOUND 1/2 X4 (GAUZE/BANDAGES/DRESSINGS) ×1
CLOTH BEACON ORANGE TIMEOUT ST (SAFETY) ×3 IMPLANT
DRSG OPSITE POSTOP 4X10 (GAUZE/BANDAGES/DRESSINGS) ×3 IMPLANT
ELECT REM PT RETURN 9FT ADLT (ELECTROSURGICAL) ×3
ELECTRODE REM PT RTRN 9FT ADLT (ELECTROSURGICAL) ×1 IMPLANT
EXTRACTOR VACUUM M CUP 4 TUBE (SUCTIONS) IMPLANT
EXTRACTOR VACUUM M CUP 4' TUBE (SUCTIONS)
GLOVE BIO SURGEON STRL SZ7 (GLOVE) ×3 IMPLANT
GLOVE BIOGEL PI IND STRL 7.0 (GLOVE) ×2 IMPLANT
GLOVE BIOGEL PI INDICATOR 7.0 (GLOVE) ×4
GOWN STRL REUS W/TWL LRG LVL3 (GOWN DISPOSABLE) ×6 IMPLANT
KIT ABG SYR 3ML LUER SLIP (SYRINGE) IMPLANT
NEEDLE HYPO 25X5/8 SAFETYGLIDE (NEEDLE) ×3 IMPLANT
NS IRRIG 1000ML POUR BTL (IV SOLUTION) ×3 IMPLANT
PACK C SECTION WH (CUSTOM PROCEDURE TRAY) ×3 IMPLANT
PAD OB MATERNITY 4.3X12.25 (PERSONAL CARE ITEMS) ×3 IMPLANT
PENCIL SMOKE EVAC W/HOLSTER (ELECTROSURGICAL) ×3 IMPLANT
SPONGE LAP 18X18 RF (DISPOSABLE) ×9 IMPLANT
STRIP CLOSURE SKIN 1/2X4 (GAUZE/BANDAGES/DRESSINGS) ×2 IMPLANT
SUT CHROMIC 0 CTX 36 (SUTURE) ×9 IMPLANT
SUT MON AB 4-0 PS1 27 (SUTURE) ×3 IMPLANT
SUT PDS AB 0 CT1 27 (SUTURE) ×6 IMPLANT
SUT VIC AB 3-0 CT1 27 (SUTURE) ×4
SUT VIC AB 3-0 CT1 TAPERPNT 27 (SUTURE) ×2 IMPLANT
TOWEL OR 17X24 6PK STRL BLUE (TOWEL DISPOSABLE) ×3 IMPLANT
TRAY FOLEY W/BAG SLVR 14FR LF (SET/KITS/TRAYS/PACK) ×3 IMPLANT

## 2018-07-24 NOTE — Op Note (Signed)
Preoperative diagnosis: 37-week breech presentation, anti-E antibody  Postoperative diagnosis: Same  Procedure: Primary low transverse cesarean section after failed ECV  Surgeon: Marcelle OverlieHolland  Anesthesia: Spinal  EBL 700 cc  Procedure findings:  The patient was taken to the operating room after an adequate level of spinal anesthetic was obtained the patient in left tilt position the abdomen prepped and draped Foley catheter position and appropriate timeouts were taken.  2 fingerbreadths above the symphysis transverse incision was made carried down through skin fascia rectus muscles divided in the midline peritoneum entered superiorly without incident and extended vertically.  The vesicouterine serosa was then incised and the bladder was bluntly and sharply dissected below, transverse incision made in the lower segment extended with bandage scissors clear fluid noted frank breech presentation was noted easy delivery of vigorous infant, the infant was suctioned cord clamped and passed to the pediatric team for further care.  Placenta was then delivered spontaneously intact.  Uterus exteriorized, cavity wiped clean with laparotomy pack closure obtained with the first layer of 0 chromic in a locked fashion followed by number cannula layer of 0 chromic.  This was hemostatic bilateral tubes and ovaries were normal.  Prior to closure sponge, needle, instrument counts reported as correct x2.  Peritoneum closed with a 3-0 Vicryl running suture the same on the rectus muscles in the midline 0 PDS used to close the fascia subcutaneous tissue was hemostatic and fairly thin was not closed separately 4-0 Monocryl subcuticular skin closure with a honeycomb dressing.  Clear urine noted in the case.  She tolerated this well went to recovery room in good condition.  Dictated with Dragon Medical 1  Mackenzie Howard M Mackenzie Scherger MD

## 2018-07-24 NOTE — Progress Notes (Signed)
Version unsuccessful.  Proceed with C/S.

## 2018-07-24 NOTE — Progress Notes (Signed)
Received sq terb>>US>>>breech, active w/ adeq AF, 2 attempts to perform ECV, w/o success>>>will go ahead with sched CS

## 2018-07-24 NOTE — Anesthesia Preprocedure Evaluation (Signed)
Anesthesia Evaluation  Patient identified by MRN, date of birth, ID band Patient awake    Reviewed: Allergy & Precautions, NPO status , Patient's Chart, lab work & pertinent test results  Airway Mallampati: II  TM Distance: >3 FB Neck ROM: Full    Dental no notable dental hx.    Pulmonary neg pulmonary ROS,    Pulmonary exam normal breath sounds clear to auscultation       Cardiovascular negative cardio ROS Normal cardiovascular exam Rhythm:Regular Rate:Normal     Neuro/Psych negative neurological ROS  negative psych ROS   GI/Hepatic negative GI ROS, Neg liver ROS,   Endo/Other  negative endocrine ROS  Renal/GU negative Renal ROS  negative genitourinary   Musculoskeletal negative musculoskeletal ROS (+)   Abdominal   Peds negative pediatric ROS (+)  Hematology negative hematology ROS (+)   Anesthesia Other Findings   Reproductive/Obstetrics (+) Pregnancy                             Anesthesia Physical Anesthesia Plan  ASA: II  Anesthesia Plan: General   Post-op Pain Management:    Induction: Intravenous  PONV Risk Score and Plan: 4 or greater and Ondansetron, Dexamethasone, Scopolamine patch - Pre-op and Treatment may vary due to age or medical condition  Airway Management Planned: Natural Airway  Additional Equipment:   Intra-op Plan:   Post-operative Plan: Extubation in OR  Informed Consent: I have reviewed the patients History and Physical, chart, labs and discussed the procedure including the risks, benefits and alternatives for the proposed anesthesia with the patient or authorized representative who has indicated his/her understanding and acceptance.   Dental advisory given  Plan Discussed with: CRNA  Anesthesia Plan Comments:         Anesthesia Quick Evaluation

## 2018-07-24 NOTE — Anesthesia Procedure Notes (Signed)
Spinal  Patient location during procedure: OR Staffing Anesthesiologist: Montez Hageman, MD Performed: anesthesiologist  Preanesthetic Checklist Completed: patient identified, site marked, surgical consent, pre-op evaluation, timeout performed, IV checked, risks and benefits discussed and monitors and equipment checked Spinal Block Patient position: sitting Prep: DuraPrep Patient monitoring: heart rate, continuous pulse ox and blood pressure Approach: midline Location: L4-5 Injection technique: single-shot Needle Needle type: Sprotte  Needle gauge: 24 G Needle length: 9 cm Additional Notes Expiration date of kit checked and confirmed. Patient tolerated procedure well, without complications.

## 2018-07-24 NOTE — Lactation Note (Signed)
This note was copied from a baby's chart. Lactation Consultation Note  Patient Name: Mackenzie Jocelyn LamerShannon Amico RUEAV'WToday's Date: 07/24/2018 Reason for consult: Initial assessment;Other (Comment);Early term 3437-38.6wks(AMA)  7 hours old FT female who is being exclusively BF by her mother, she's a P2. Mom has some experience BF, she was able to BF her first child for only 2 weeks due to a difficult latch. Per mom it was painful and she just quit pumping because she wasn't getting more than 3-4 ounces at a time. Explained to mom that any amount of breastmilk is still going to be beneficial to the baby. Mom has a Medela DEBP at home.  She is already familiar with hand expression and able to get some colostrum. Baby already nursing when entering the room, mom covering baby with blankets. Assisted mom with correcting positioning, baby already loosing depth, latch was somehow shallow. Baby fed for 22 minutes, and mom had a small positional stripe on the base of her nipple at the end of the feeding. Discussed with parents some tips for a deep latch to prevent sore nipples. Cluster feeding, benefits of STS and feeding cues were also discussed. Dad was very proud on how well baby was doing at the breast, he told LC she latched in PACU.  Feeding plan:  1. Encouraged mom to feed baby STS 8-12 times/24 hours or sooner if feeding cues are present 2. Hand expression and spoon feeding were also encouraged  BF brochure, BF resources and feeding diary were reviewed. Parents reported all questions and concerns were answered, they're both aware of LC services and will call PRN.  Maternal Data Formula Feeding for Exclusion: No Has patient been taught Hand Expression?: Yes Does the patient have breastfeeding experience prior to this delivery?: Yes  Feeding Feeding Type: Breast Fed  LATCH Score Latch: Grasps breast easily, tongue down, lips flanged, rhythmical sucking.(per mom she did in the beginning of the  feeding)  Audible Swallowing: A few with stimulation  Type of Nipple: Everted at rest and after stimulation  Comfort (Breast/Nipple): Soft / non-tender  Hold (Positioning): Assistance needed to correctly position infant at breast and maintain latch.  LATCH Score: 8  Interventions Interventions: Breast feeding basics reviewed;Adjust position;Skin to skin;Breast compression;Assisted with latch  Lactation Tools Discussed/Used WIC Program: No   Consult Status Consult Status: Follow-up Date: 07/25/18 Follow-up type: In-patient    Eivan Gallina Venetia ConstableS Oriel Ojo 07/24/2018, 6:15 PM

## 2018-07-24 NOTE — Transfer of Care (Signed)
Immediate Anesthesia Transfer of Care Note  Patient: Mackenzie Howard  Procedure(s) Performed: CESAREAN SECTION (N/A Abdomen)  Patient Location: PACU  Anesthesia Type:Spinal  Level of Consciousness: awake, alert  and oriented  Airway & Oxygen Therapy: Patient Spontanous Breathing  Post-op Assessment: Report given to RN and Post -op Vital signs reviewed and stable  Post vital signs: Reviewed and stable  Last Vitals:  Vitals Value Taken Time  BP 96/64 07/24/2018 11:46 AM  Temp    Pulse    Resp 26 07/24/2018 11:48 AM  SpO2    Vitals shown include unvalidated device data.  Last Pain:  Vitals:   07/24/18 0925  TempSrc: Oral         Complications: No apparent anesthesia complications

## 2018-07-25 ENCOUNTER — Other Ambulatory Visit (HOSPITAL_COMMUNITY): Payer: BC Managed Care – PPO

## 2018-07-25 ENCOUNTER — Ambulatory Visit (HOSPITAL_COMMUNITY): Admission: RE | Admit: 2018-07-25 | Payer: BC Managed Care – PPO | Source: Ambulatory Visit

## 2018-07-25 LAB — CBC
HCT: 29.4 % — ABNORMAL LOW (ref 36.0–46.0)
Hemoglobin: 9.3 g/dL — ABNORMAL LOW (ref 12.0–15.0)
MCH: 29.9 pg (ref 26.0–34.0)
MCHC: 31.6 g/dL (ref 30.0–36.0)
MCV: 94.5 fL (ref 80.0–100.0)
Platelets: 202 10*3/uL (ref 150–400)
RBC: 3.11 MIL/uL — ABNORMAL LOW (ref 3.87–5.11)
RDW: 17.9 % — ABNORMAL HIGH (ref 11.5–15.5)
WBC: 9.7 10*3/uL (ref 4.0–10.5)
nRBC: 0 % (ref 0.0–0.2)

## 2018-07-25 MED ORDER — OXYCODONE HCL 5 MG PO TABS: 10.0000 mg | ORAL_TABLET | ORAL | Status: DC | PRN

## 2018-07-25 MED ORDER — OXYCODONE HCL 5 MG PO TABS
5.0000 mg | ORAL_TABLET | ORAL | Status: DC | PRN
  Administered 2018-07-25 – 2018-07-26 (×2): 5 mg via ORAL
  Filled 2018-07-25 (×2): qty 1

## 2018-07-25 MED ORDER — IBUPROFEN 600 MG PO TABS
600.0000 mg | ORAL_TABLET | Freq: Four times a day (QID) | ORAL | Status: DC
  Administered 2018-07-25 – 2018-07-26 (×5): 600 mg via ORAL
  Filled 2018-07-25 (×5): qty 1

## 2018-07-25 NOTE — Anesthesia Postprocedure Evaluation (Signed)
Anesthesia Post Note  Patient: Mackenzie Howard  Procedure(s) Performed: CESAREAN SECTION (N/A Abdomen)     Patient location during evaluation: Mother Baby Anesthesia Type: General Level of consciousness: awake Pain management: satisfactory to patient Vital Signs Assessment: post-procedure vital signs reviewed and stable Respiratory status: spontaneous breathing Cardiovascular status: stable Anesthetic complications: no    Last Vitals:  Vitals:   07/25/18 0148 07/25/18 0505  BP: (!) 84/57 93/72  Pulse: 83 67  Resp:    Temp: 36.6 C   SpO2:      Last Pain:  Vitals:   07/25/18 0505  TempSrc:   PainSc: 4    Pain Goal:                 KeyCorpBURGER,Burrel Legrand

## 2018-07-25 NOTE — Lactation Note (Signed)
This note was copied from a baby's chart. Lactation Consultation Note  Patient Name: Girl Jocelyn LamerShannon Gongora DGLOV'FToday's Date: 07/25/2018   Visitors in room.  Baby girl Romie LeveeBeckett with good voids and stools and minimal weight loss.  Visitors in room.  Mom reports left nipple is very sore.  Parents report they have  not done any hand expression or spoon feeding.  Mom reports she did not make enough milk with her first baby.  Urged mom to call for feeding observation.  And to rub expressed breastmilk on nipples and air dry past bf and hand express prior to breastfeeding right before latching.  Maternal Data    Feeding    LATCH Score                   Interventions    Lactation Tools Discussed/Used     Consult Status      Ryn Peine Michaelle CopasS Deboy 07/25/2018, 4:51 PM

## 2018-07-25 NOTE — Lactation Note (Signed)
This note was copied from a baby's chart. Lactation Consultation Note  Patient Name: Mackenzie Jocelyn LamerShannon Griffeth UXLKG'MToday's Date: 07/25/2018 Reason for consult: Follow-up assessment Parents want to be seen by lactation for assistance with breastfeeding.  Mom has blister and reddened area on left breast.  Reports usually feeds in football.  Assisted mom with feeding in cradle hold.  Infant breastfed about 10 minutes on left and came off.  Still showing cues.  Minimal assist with latching on moms right breast. Mom reports comfort.  Showed mom and dad how to hand express.  Urged hand expression and spoon feeding back all mothers milk past breastfeeding via spoon.  Left mom and baby breastfeeding.  Mom reports comfort.  Urged mom to call as needed.   Maternal Data    Feeding Feeding Type: Breast Fed  LATCH Score Latch: Grasps breast easily, tongue down, lips flanged, rhythmical sucking.  Audible Swallowing: A few with stimulation  Type of Nipple: Everted at rest and after stimulation  Comfort (Breast/Nipple): Filling, red/small blisters or bruises, mild/mod discomfort  Hold (Positioning): Assistance needed to correctly position infant at breast and maintain latch.  LATCH Score: 7  Interventions Interventions: Assisted with latch;Hand express;Support pillows;Position options  Lactation Tools Discussed/Used     Consult Status Consult Status: Follow-up Date: 07/26/18 Follow-up type: In-patient    Sefora Tietje Michaelle CopasS Sarwar 07/25/2018, 5:02 PM

## 2018-07-25 NOTE — Progress Notes (Signed)
Subjective: Postpartum Day 1: Cesarean Delivery s/s breech presentation in the setting of being 6537 wga with increasing Anti E titers/failed ECV.  Patient reports tolerating PO.    Objective: Vital signs in last 24 hours: Temp:  [97 F (36.1 C)-98.9 F (37.2 C)] 97.8 F (36.6 C) (12/30 0148) Pulse Rate:  [67-93] 67 (12/30 0505) Resp:  [15-26] 16 (12/29 2141) BP: (84-104)/(50-75) 93/72 (12/30 0505) SpO2:  [95 %-100 %] 98 % (12/29 2141)  Physical Exam:  General: alert, cooperative and appears stated age Lochia: appropriate Uterine Fundus: firm Incision: healing well, no significant drainage, no dehiscence, no significant erythema DVT Evaluation: No evidence of DVT seen on physical exam. Negative Homan's sign. No cords or calf tenderness. No significant calf/ankle edema.  Recent Labs    07/22/18 0856 07/25/18 0536  HGB 11.4* 9.3*  HCT 35.7* 29.4*    Assessment/Plan: Status post Cesarean section. Doing well postoperatively.  Continue current care.   Ranae Pilalise Jennifer Leger 07/25/2018, 8:43 AM

## 2018-07-26 ENCOUNTER — Encounter (HOSPITAL_COMMUNITY): Payer: Self-pay | Admitting: *Deleted

## 2018-07-26 MED ORDER — OXYCODONE HCL 5 MG PO TABS: 5.0000 mg | ORAL_TABLET | ORAL | 0 refills | Status: AC | PRN

## 2018-07-26 MED ORDER — IBUPROFEN 600 MG PO TABS: 600.0000 mg | ORAL_TABLET | Freq: Four times a day (QID) | ORAL | 0 refills | Status: AC

## 2018-07-26 MED ORDER — FERROUS SULFATE 325 (65 FE) MG PO TBEC: 325.0000 mg | DELAYED_RELEASE_TABLET | Freq: Two times a day (BID) | ORAL | 2 refills | Status: AC

## 2018-07-26 NOTE — Discharge Summary (Signed)
Obstetric Discharge Summary Reason for Admission: cesarean section and s/s breech and anti E Ab Prenatal Procedures: ECV - failed Intrapartum Procedures: cesarean: low cervical, transverse Postpartum Procedures: none Complications-Operative and Postpartum: none Hemoglobin  Date Value Ref Range Status  07/25/2018 9.3 (L) 12.0 - 15.0 g/dL Final   HCT  Date Value Ref Range Status  07/25/2018 29.4 (L) 36.0 - 46.0 % Final    Physical Exam:  General: alert, cooperative and appears stated age 14Lochia: appropriate Uterine Fundus: firm Incision: healing well, no significant drainage, no dehiscence, no significant erythema DVT Evaluation: No evidence of DVT seen on physical exam. Negative Homan's sign. No cords or calf tenderness. No significant calf/ankle edema.  Discharge Diagnoses: Term Pregnancy-delivered  Discharge Information: Date: 07/26/2018 Activity: pelvic rest Diet: routine Medications: Ibuprofen, Colace, Iron and oxycodone Condition: stable Instructions: refer to practice specific booklet Discharge to: home   Newborn Data: Live born female  Birth Weight: 7 lb 3.5 oz (3275 g) APGAR: 8, 8  Newborn Delivery   Birth date/time:  07/24/2018 10:55:00 Delivery type:  C-Section, Low Transverse Trial of labor:  No C-section categorization:  Primary     Home with mother.  Mackenzie Howard Mackenzie Howard 07/26/2018, 9:01 AM

## 2018-07-26 NOTE — Progress Notes (Signed)
Parent request formula to supplement breast feeding due to baby's difficulty to achieve latch and maintain sucking, and Mother's sore nipples. Mother concerned over not eating well .Parents have been informed of small tummy size of newborn, taught hand expression and understand the possible consequences of formula to the health of the infant. The possible consequences shared with patient include 1) Loss of confidence in breastfeeding 2) Engorgement 3) Allergic sensitization of baby(asthma/allergies) and 4) decreased milk supply for mother.After discussion of the above the mother decided to  supplement with formula.  The tool used to give formula supplement will be  curve tip syringe and slow flow nipple.

## 2018-07-28 LAB — TYPE AND SCREEN
ABO/RH(D): O POS
Antibody Screen: POSITIVE
Donor AG Type: NEGATIVE
Donor AG Type: NEGATIVE
Donor AG Type: NEGATIVE
Donor AG Type: NEGATIVE
PT AG Type: NEGATIVE
UNIT DIVISION: 0
Unit division: 0
Unit division: 0
Unit division: 0

## 2018-07-28 LAB — BPAM RBC
Blood Product Expiration Date: 202001072359
Blood Product Expiration Date: 202001282359
Blood Product Expiration Date: 202001282359
ISSUE DATE / TIME: 201912310533
UNIT TYPE AND RH: 9500
Unit Type and Rh: 9500
Unit Type and Rh: 9500

## 2018-08-01 ENCOUNTER — Other Ambulatory Visit (HOSPITAL_COMMUNITY): Payer: BC Managed Care – PPO

## 2018-09-19 ENCOUNTER — Ambulatory Visit: Payer: Self-pay | Admitting: Rehabilitative and Restorative Service Providers"

## 2018-10-10 ENCOUNTER — Ambulatory Visit: Payer: Self-pay | Admitting: Rehabilitative and Restorative Service Providers"

## 2018-11-04 ENCOUNTER — Ambulatory Visit
Admission: AD | Admit: 2018-11-04 | Discharge: 2018-11-04 | Disposition: A | Discharge status: Home / Self Care | Payer: 344 | Source: Ambulatory Visit | Attending: Emergency Medicine | Admitting: Emergency Medicine

## 2018-11-04 ENCOUNTER — Encounter: Payer: Self-pay | Admitting: Emergency Medicine

## 2018-11-04 DIAGNOSIS — R0989 Other specified symptoms and signs involving the circulatory and respiratory systems: Secondary | ICD-10-CM | POA: Insufficient documentation

## 2018-11-04 DIAGNOSIS — Z03818 Encounter for observation for suspected exposure to other biological agents ruled out: Secondary | ICD-10-CM | POA: Insufficient documentation

## 2018-11-04 DIAGNOSIS — J029 Acute pharyngitis, unspecified: Secondary | ICD-10-CM | POA: Insufficient documentation

## 2018-11-04 LAB — POCT AMBULATORY RAPID STREP
Lot #: 191443
Rapid Strep Group A Throat-POC: NEGATIVE

## 2018-11-04 NOTE — Discharge Instructions (Signed)
HOME CARE   Only take medicine as told by your doctor.  Drink enough fluids to keep your pee (urine) clear or pale yellow.  Rest as needed.  Try using throat sprays, lozenges, or suck on hard candy (if older than 4 years or as told).  Sip warm liquids, such as broth, herbal tea, or warm water with honey. Try sucking on frozen ice pops or drinking cold liquids.  Rinse the mouth (gargle) with salt water. Mix 1 teaspoon salt with 8 ounces of water.  Do not smoke. Avoid being around others when they are smoking.  Put a humidifier in your bedroom at night to moisten the air. You can also turn on a hot shower and sit in the bathroom for 5-10 minutes. Be sure the bathroom door is closed.  You may use Tylenol 650mg  every 6 hours for pain.  Please take with food to avoid upset stomach, nausea or vomiting.   If you are able to, you may use Ibuprofen 400-600mg  every 6 hours for pain.  Please take with food to avoid upset stomach, nausea or vomiting.    Please do not take if you were advised not to take either Tylenol, or Ibuprofen.   For better pain and/or fever control try alternating use of Tylenol 650mg  and ibuprofen 400-600mg  every 3 hours until your symptoms improve.    Orders Placed This Encounter    Strep A culture, throat    COVID-19 PCR    POCT rapid strep       Recent Results (from the past 24 hour(s))   POCT rapid strep    Collection Time: 11/04/18 11:21 AM   Result Value Ref Range    Rapid Strep Group A Throat-POC Negative for Streptococcus Group A Antigen Negative    INTERNAL CONTROL RAPID STREP POCT *Yes-internal procedural control(s) acceptable     Exp date 3/21     Lot # [id number]            Final Diagnosis    ICD-10-CM ICD-9-CM   1. Chest congestion R09.89 786.9   2. Sore throat J02.9 462       Encourage fluids, encourage rest, good hand hygiene.    Use over the counter medications as discussed.    Please start the new medications as below:    Current Discharge Medication List            Please follow up with  your physician as below:    Follow-up Information     Call  Freddie Apley, MD.    Specialty:  Family Medicine  Why:  As needed, If symptoms worsen  Contact information:  [address]  [address]  [place] [address]  [phone]                 Thank you Rachel Spears for coming to [facility] for your health care concerns.    If your condition changes and/or worsens please follow up with her primary doctor and/or return to the urgent care center.    If short of breath, chest pains or any other concerns please report to the emergency room.    In the event of an Emergency dial 911.

## 2018-11-04 NOTE — ED Triage Notes (Signed)
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
## 2018-11-06 LAB — STREP A CULTURE, THROAT: Group A Strep Throat Culture: 0

## 2019-02-24 ENCOUNTER — Other Ambulatory Visit
Admission: RE | Admit: 2019-02-24 | Discharge: 2019-02-24 | Disposition: A | Discharge status: Home / Self Care | Payer: MEDICAID | Source: Ambulatory Visit | Attending: Obstetrics | Admitting: Obstetrics

## 2019-02-24 ENCOUNTER — Other Ambulatory Visit
Admission: RE | Admit: 2019-02-24 | Discharge: 2019-02-24 | Disposition: A | Discharge status: Home / Self Care | Payer: MEDICAID | Source: Ambulatory Visit | Attending: Pathology | Admitting: Pathology

## 2019-02-24 DIAGNOSIS — N870 Mild cervical dysplasia: Secondary | ICD-10-CM | POA: Insufficient documentation

## 2019-02-24 DIAGNOSIS — Z113 Encounter for screening for infections with a predominantly sexual mode of transmission: Secondary | ICD-10-CM | POA: Insufficient documentation

## 2019-02-24 DIAGNOSIS — N841 Polyp of cervix uteri: Secondary | ICD-10-CM | POA: Insufficient documentation

## 2019-02-24 DIAGNOSIS — N760 Acute vaginitis: Secondary | ICD-10-CM | POA: Insufficient documentation

## 2019-02-25 LAB — VAGINITIS SCREEN: DNA PROBE: Vaginitis Screen:DNA Probe: POSITIVE — AB

## 2019-02-27 LAB — CHLAMYDIA PLASMID DNA AMPLIFICATION: Chlamydia Plasmid DNA Amplification: 0

## 2019-02-27 LAB — N. GONORRHOEAE DNA AMPLIFICATION: N. gonorrhoeae DNA Amplification: 0

## 2019-02-27 LAB — TRICHOMONAS DNA AMPLIFICATION: Trichomonas DNA amplification: 0

## 2019-03-01 LAB — SURGICAL PATHOLOGY

## 2019-04-10 ENCOUNTER — Other Ambulatory Visit: Payer: Self-pay | Admitting: *Deleted

## 2019-04-10 DIAGNOSIS — Z20822 Contact with and (suspected) exposure to covid-19: Secondary | ICD-10-CM

## 2019-04-11 LAB — NOVEL CORONAVIRUS, NAA: SARS-CoV-2, NAA: NOT DETECTED

## 2019-04-26 ENCOUNTER — Other Ambulatory Visit: Payer: Self-pay

## 2019-04-26 DIAGNOSIS — Z20822 Contact with and (suspected) exposure to covid-19: Secondary | ICD-10-CM

## 2019-04-28 LAB — NOVEL CORONAVIRUS, NAA: SARS-CoV-2, NAA: NOT DETECTED

## 2019-08-23 ENCOUNTER — Other Ambulatory Visit: Payer: BC Managed Care – PPO

## 2019-08-23 ENCOUNTER — Ambulatory Visit: Payer: BC Managed Care – PPO | Attending: Internal Medicine

## 2019-08-23 DIAGNOSIS — Z20822 Contact with and (suspected) exposure to covid-19: Secondary | ICD-10-CM

## 2019-08-24 LAB — NOVEL CORONAVIRUS, NAA: SARS-CoV-2, NAA: NOT DETECTED

## 2019-11-02 ENCOUNTER — Other Ambulatory Visit: Payer: Self-pay | Admitting: Obstetrics & Gynecology

## 2019-11-02 DIAGNOSIS — N644 Mastodynia: Secondary | ICD-10-CM

## 2019-11-16 ENCOUNTER — Ambulatory Visit: Payer: BC Managed Care – PPO

## 2019-11-16 ENCOUNTER — Ambulatory Visit
Admission: RE | Admit: 2019-11-16 | Discharge: 2019-11-16 | Disposition: A | Discharge status: Home / Self Care | Payer: BC Managed Care – PPO | Source: Ambulatory Visit | Attending: Obstetrics & Gynecology | Admitting: Obstetrics & Gynecology

## 2019-11-16 ENCOUNTER — Other Ambulatory Visit: Payer: Self-pay

## 2019-11-16 DIAGNOSIS — N644 Mastodynia: Secondary | ICD-10-CM

## 2020-02-23 IMAGING — US US MFM OB DETAIL+14 WK
1 series · 13 of 28 positions shown · non-contrast
Comparison: none

[Series 1: us mfm ob detail+14 wk · 77 acquisitions, 13 frames shown]
[im 3/77]
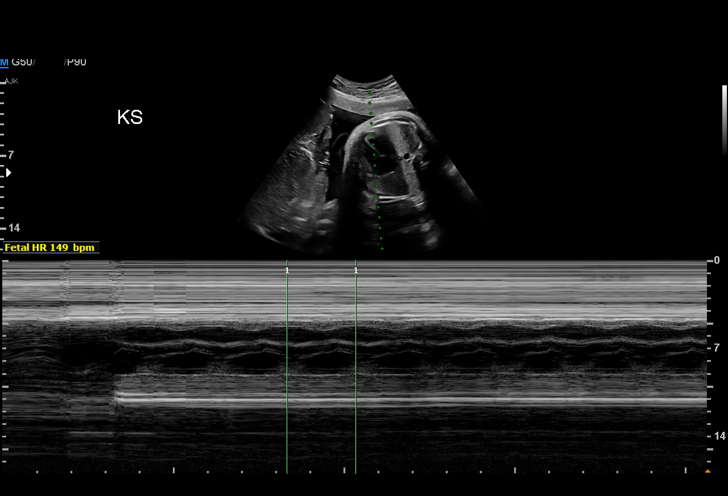
[im 9/77]
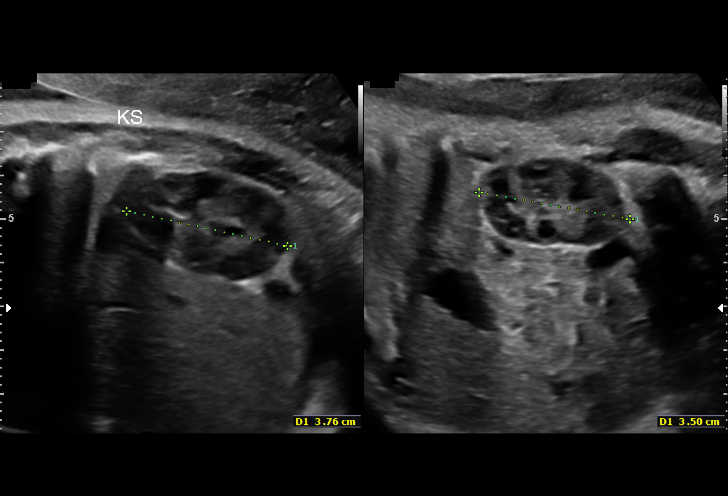
[im 15/77]
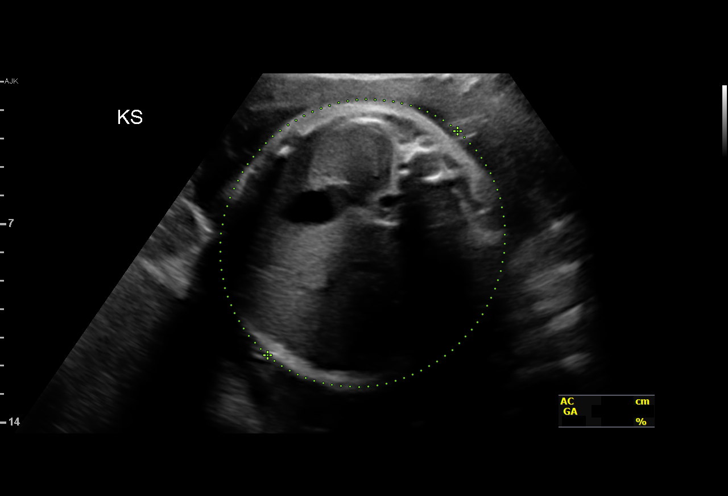
[im 20/77]
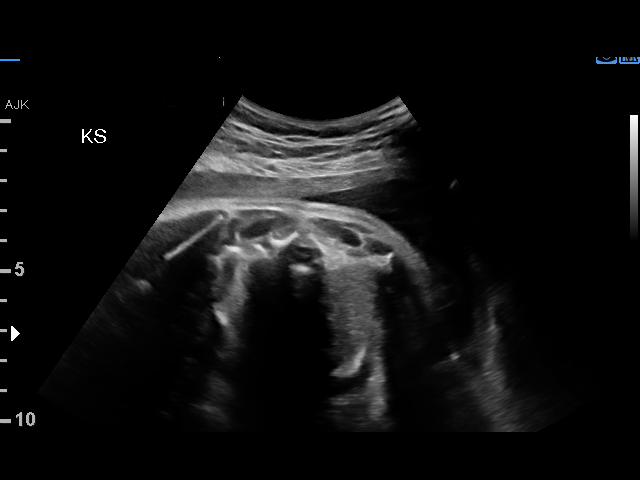
[im 26/77]
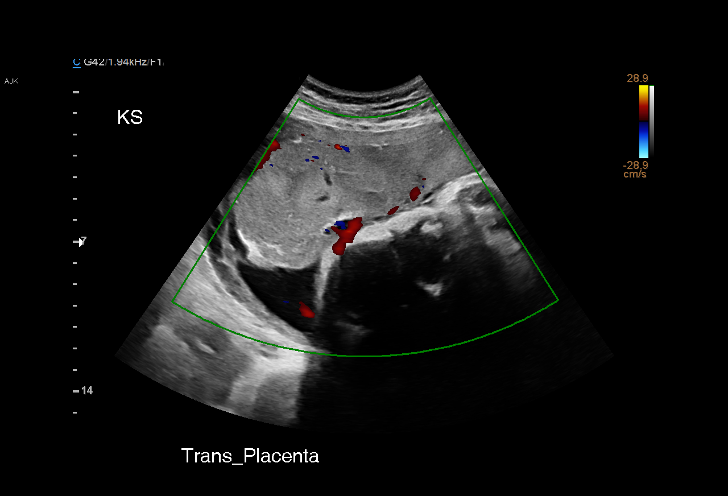
[im 31/77]
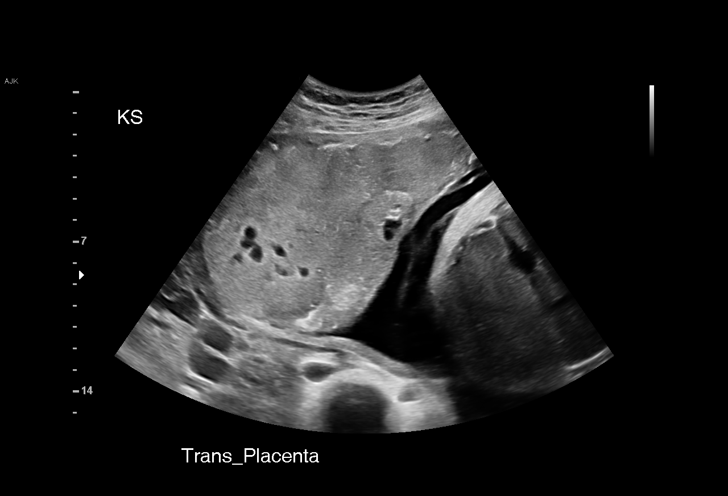
[im 40/77]
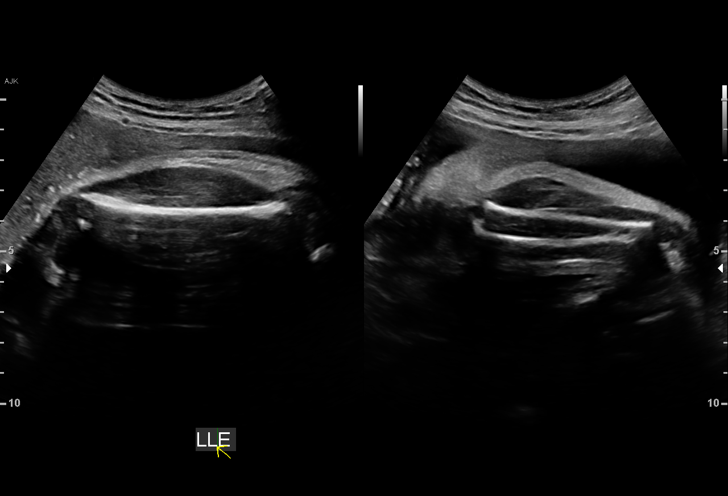
[im 46/77]
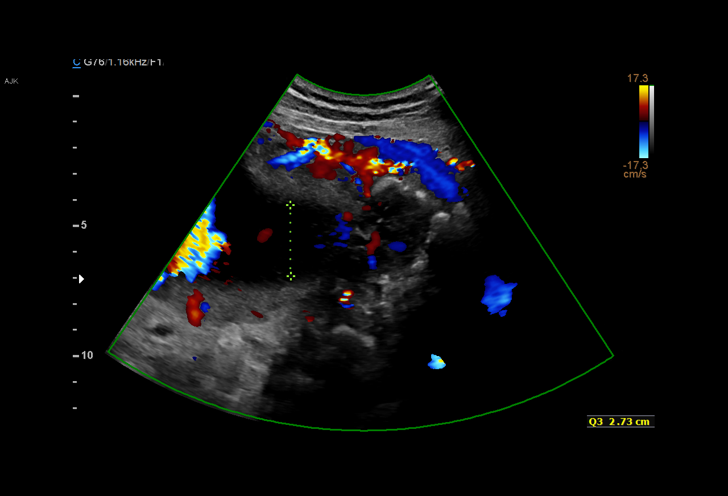
[im 51/77]
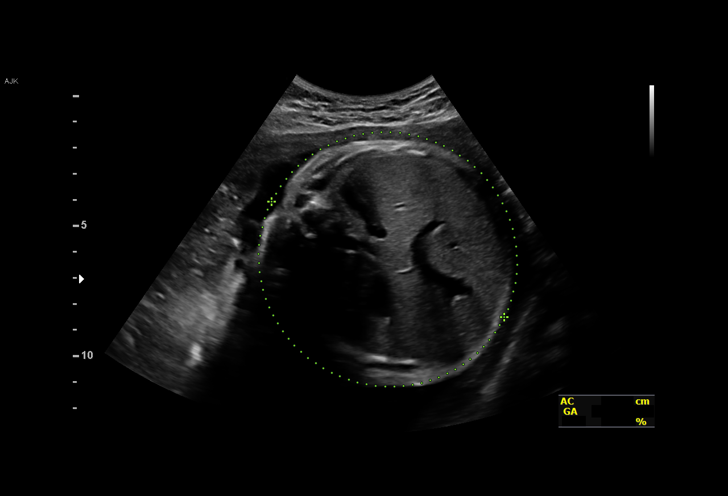
[im 57/77]
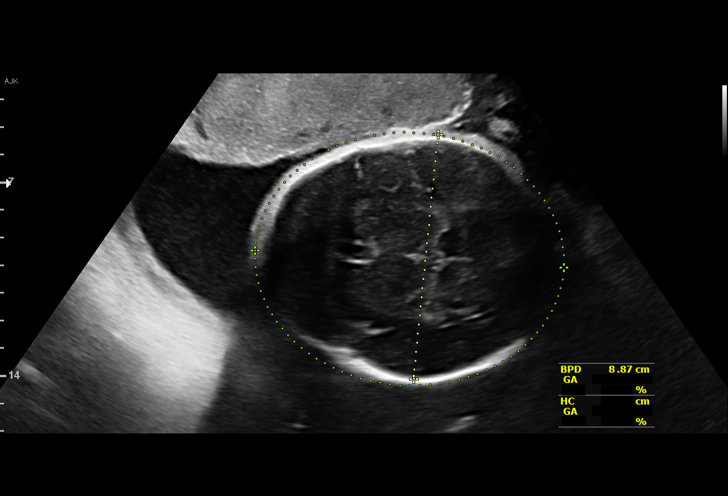
[im 62/77]
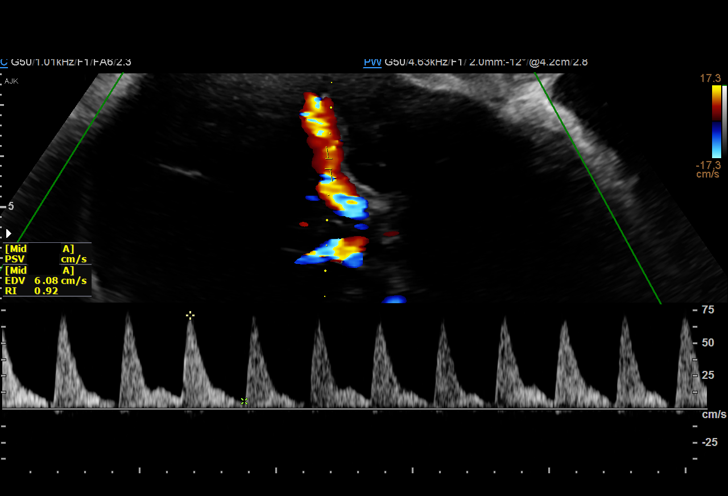
[im 68/77]
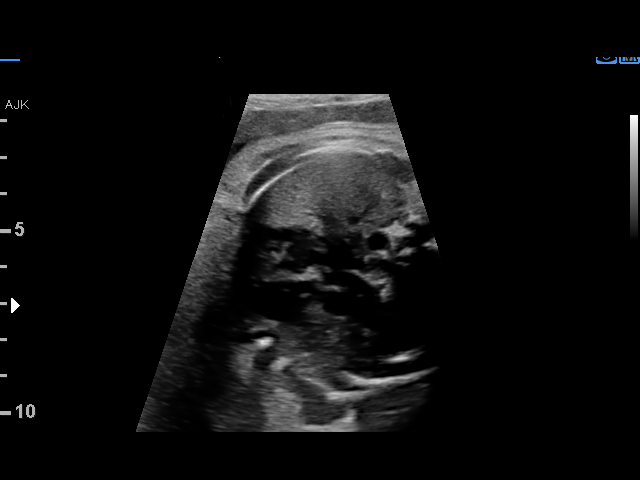
[im 74/77]
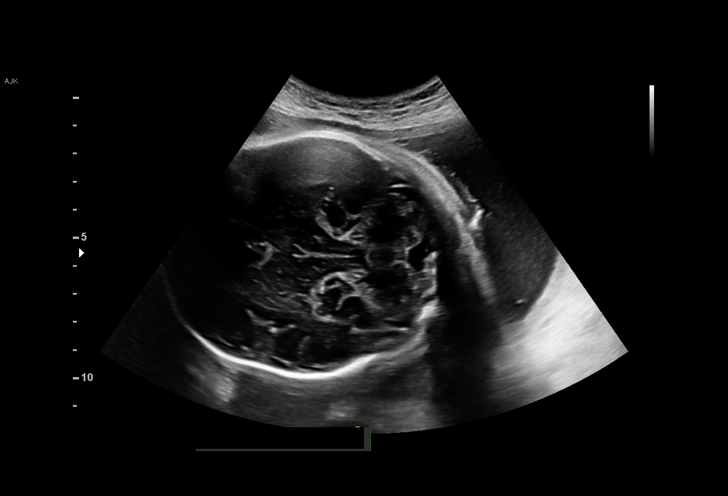

[13 of 28 positions shown; findings below may reference images not displayed]

2  US MFM MCA DOPPLER                   76821.01     KENK BERWARE
 ----------------------------------------------------------------------

 ----------------------------------------------------------------------
Indications

  Encounter for antenatal screening for
  malformations
  Anti-E isoimmunization affecting pregnancy
  in 3rd trimester
  Advanced maternal age multigravida 35+,
  third trimester (low risk NIPs)
  34 weeks gestation of pregnancy
 ----------------------------------------------------------------------
Vital Signs

 BMI:
Fetal Evaluation

 Num Of Fetuses:          1
 Fetal Heart Rate(bpm):   149
 Cardiac Activity:        Observed
 Presentation:            Breech
 Placenta:                Anterior
 P. Cord Insertion:       Visualized, central

 Amniotic Fluid
 AFI FV:      Within normal limits

 AFI Sum(cm)     %Tile       Largest Pocket(cm)
 16.53           60

 RUQ(cm)       RLQ(cm)       LUQ(cm)        LLQ(cm)

Biometry
 BPD:      88.7  mm     G. Age:  35w 6d         86  %    CI:        75.18   %    70 - 86
                                                         FL/HC:       19.5  %    19.4 -
 HC:      324.5  mm     G. Age:  36w 5d         73  %    HC/AC:       1.06       0.96 -
 AC:      306.3  mm     G. Age:  34w 4d         59  %    FL/BPD:      71.4  %    71 - 87
 FL:       63.3  mm     G. Age:  32w 5d          8  %    FL/AC:       20.7  %    20 - 24
 CER:      45.1  mm     G. Age:  N/A          > 95  %

 LV:        3.6  mm

 Est. FW:    9992   gm     5 lb 6 oz     60  %
OB History

 Gravidity:    3         Term:   1         SAB:   1
 TOP:          0       Ectopic:  0        Living: 1
Gestational Age

 LMP:           34w 3d        Date:  11/05/17                 EDD:   08/12/18
 U/S Today:     35w 0d                                        EDD:   08/08/18
 Best:          34w 3d     Det. By:  LMP  (11/05/17)          EDD:   08/12/18
Anatomy

 Cranium:               Appears normal         Aortic Arch:            Appears normal
 Cavum:                 Appears normal         Ductal Arch:            Not well visualized
 Ventricles:            Appears normal         Diaphragm:              Appears normal
 Choroid Plexus:        Appears normal         Stomach:                Appears normal, left
                                                                       sided
 Cerebellum:            Appears normal         Abdomen:                Appears normal
 Posterior Fossa:       Appears normal         Abdominal Wall:         Appears nml (cord
                                                                       insert, abd wall)
 Nuchal Fold:           Not applicable (>20    Cord Vessels:           Appears normal (3
                        wks GA)                                        vessel cord)
 Face:                  Appears normal         Kidneys:                Appear normal
                        (orbits and profile)
 Lips:                  Not well visualized    Bladder:                Appears normal
 Thoracic:              Appears normal         Spine:                  Appears normal
 Heart:                 Appears normal         Upper Extremities:      Limited views NML
                        (4CH, axis, and
                        situs)
 RVOT:                  Appears normal         Lower Extremities:      LT NML; RT NWS
 LVOT:                  Appears normal

 Other:  Fetus appears to be female. Technically difficult due to advanced GA
         and fetal position.
Doppler - Fetal Vessels

 Middle Cerebral Artery
                                                    PSV    MoM
                                                  (cm/s)


Impression

 Follow up normal interval growth.  No ultrasonic evidence of
 structural fetal anomalies.
 Suboptimal view of the fetal anatomy were observed today
 MCA
Recommendations

 Follow up MCA in 1 week.
 Growth in 2-3

## 2020-03-29 DIAGNOSIS — G43109 Migraine with aura, not intractable, without status migrainosus: Secondary | ICD-10-CM | POA: Insufficient documentation

## 2020-03-29 DIAGNOSIS — G43719 Chronic migraine without aura, intractable, without status migrainosus: Secondary | ICD-10-CM | POA: Insufficient documentation

## 2020-04-08 ENCOUNTER — Other Ambulatory Visit
Admission: RE | Admit: 2020-04-08 | Discharge: 2020-04-08 | Disposition: A | Discharge status: Home / Self Care | Payer: Medicaid Other | Source: Ambulatory Visit | Attending: Obstetrics and Gynecology | Admitting: Obstetrics and Gynecology

## 2020-04-08 ENCOUNTER — Other Ambulatory Visit
Admission: RE | Admit: 2020-04-08 | Discharge: 2020-04-08 | Disposition: A | Discharge status: Home / Self Care | Payer: Medicaid Other | Source: Ambulatory Visit

## 2020-04-08 DIAGNOSIS — Z01419 Encounter for gynecological examination (general) (routine) without abnormal findings: Secondary | ICD-10-CM | POA: Insufficient documentation

## 2020-04-08 DIAGNOSIS — Z124 Encounter for screening for malignant neoplasm of cervix: Secondary | ICD-10-CM | POA: Insufficient documentation

## 2020-04-08 DIAGNOSIS — N760 Acute vaginitis: Secondary | ICD-10-CM | POA: Insufficient documentation

## 2020-04-08 DIAGNOSIS — Z113 Encounter for screening for infections with a predominantly sexual mode of transmission: Secondary | ICD-10-CM | POA: Insufficient documentation

## 2020-04-09 LAB — N. GONORRHOEAE DNA AMPLIFICATION: N. gonorrhoeae DNA Amplification: 0

## 2020-04-09 LAB — VAGINITIS SCREEN: DNA PROBE: Vaginitis Screen:DNA Probe: 0

## 2020-04-09 LAB — CHLAMYDIA PLASMID DNA AMPLIFICATION: Chlamydia Plasmid DNA Amplification: 0

## 2020-04-12 LAB — GYN CYTOLOGY

## 2020-05-20 ENCOUNTER — Other Ambulatory Visit
Admission: RE | Admit: 2020-05-20 | Discharge: 2020-05-20 | Disposition: A | Discharge status: Home / Self Care | Payer: Medicaid Other | Source: Ambulatory Visit | Attending: Obstetrics and Gynecology | Admitting: Obstetrics and Gynecology

## 2020-05-20 DIAGNOSIS — R87810 Cervical high risk human papillomavirus (HPV) DNA test positive: Secondary | ICD-10-CM | POA: Insufficient documentation

## 2020-05-20 DIAGNOSIS — N760 Acute vaginitis: Secondary | ICD-10-CM | POA: Insufficient documentation

## 2020-05-20 DIAGNOSIS — Z113 Encounter for screening for infections with a predominantly sexual mode of transmission: Secondary | ICD-10-CM | POA: Insufficient documentation

## 2020-05-21 LAB — VAGINITIS SCREEN: DNA PROBE: Vaginitis Screen:DNA Probe: 0

## 2020-05-22 LAB — N. GONORRHOEAE DNA AMPLIFICATION: N. gonorrhoeae DNA Amplification: 0

## 2020-05-22 LAB — CHLAMYDIA PLASMID DNA AMPLIFICATION: Chlamydia Plasmid DNA Amplification: 0

## 2020-05-28 LAB — SURGICAL PATHOLOGY

## 2021-07-07 IMAGING — MG DIGITAL DIAGNOSTIC BILAT W/ TOMO W/ CAD
8 series · 9 of 24 positions shown · non-contrast
Comparison: None.

CLINICAL DATA: 38-year-old female presenting for evaluation of
nonfocal breast pain the left breast involving the lower outer and
upper outer quadrant since about Alberto.

EXAM:
DIGITAL DIAGNOSTIC BILATERAL MAMMOGRAM WITH CAD AND TOMO

[R CC synth-2D]
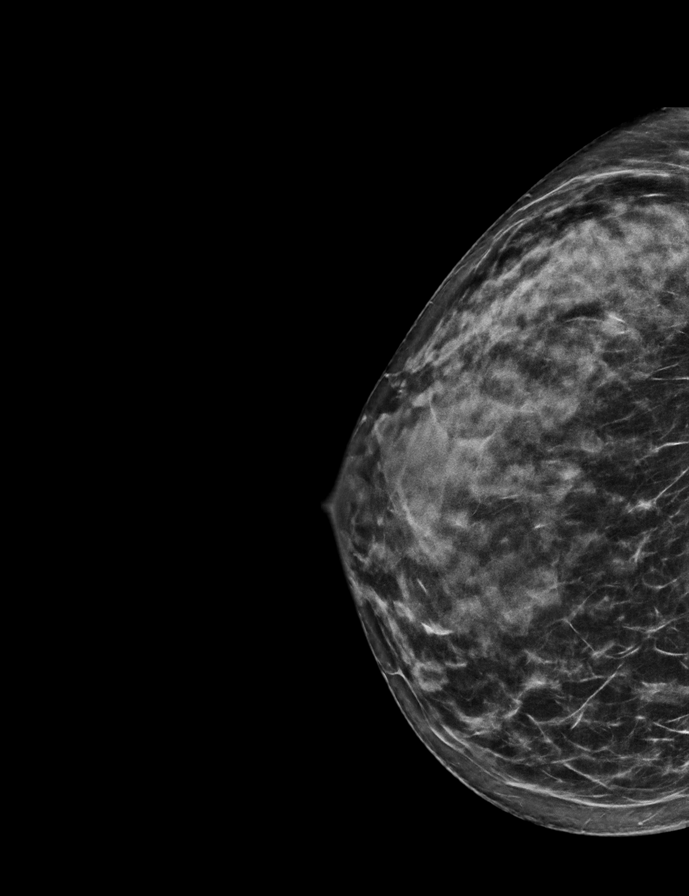

[L MLO synth-2D]
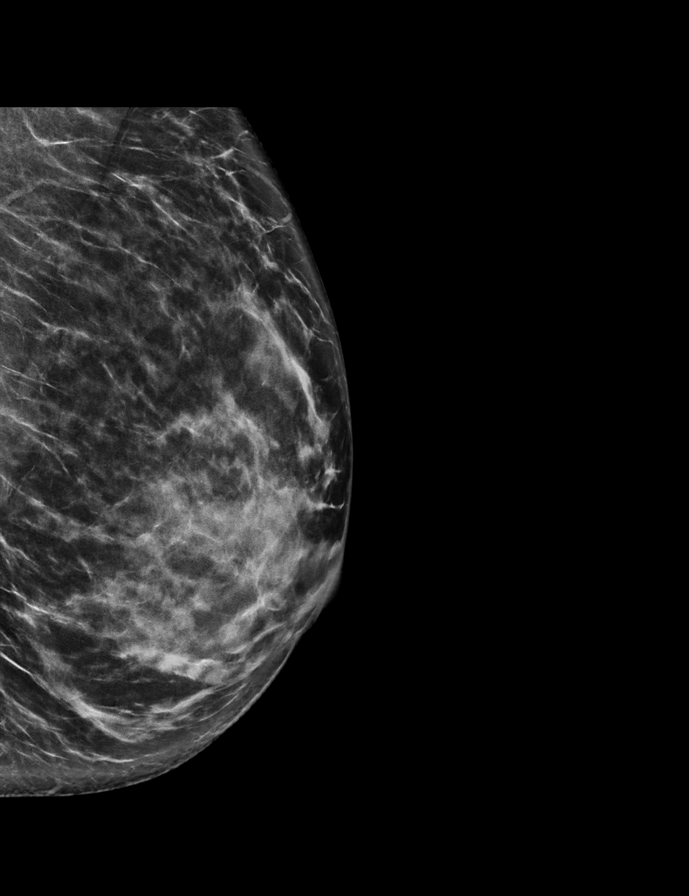

[L CC synth-2D]
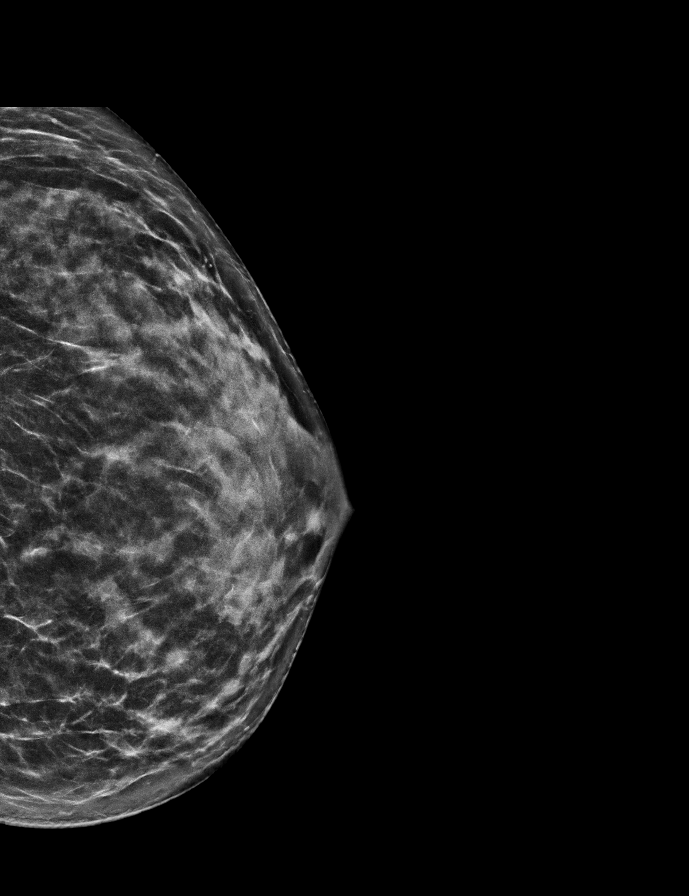

[R MLO synth-2D]
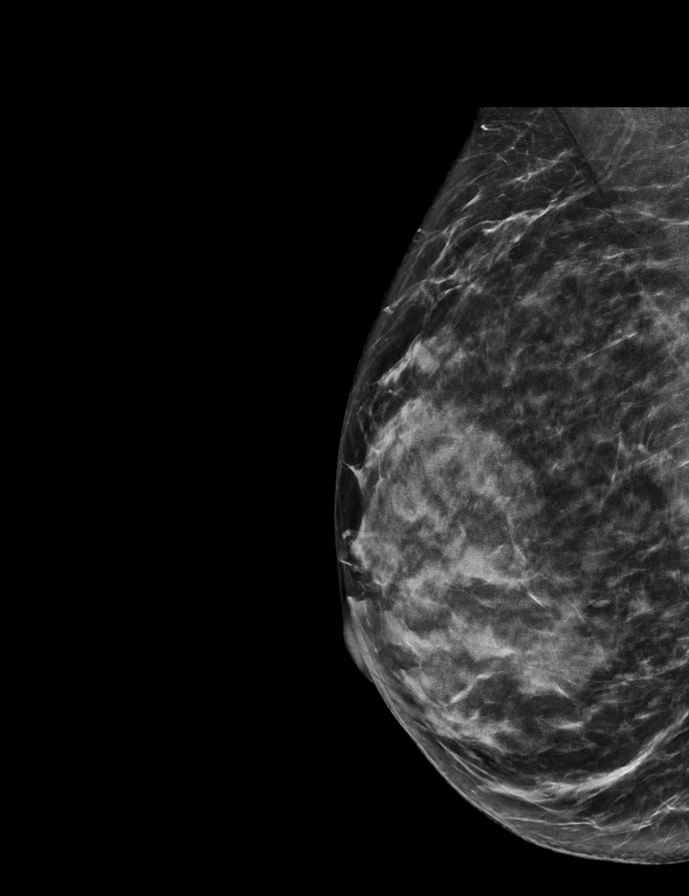

[L MLO tomo · 2 of 68 frames shown]
[frame 22/68]
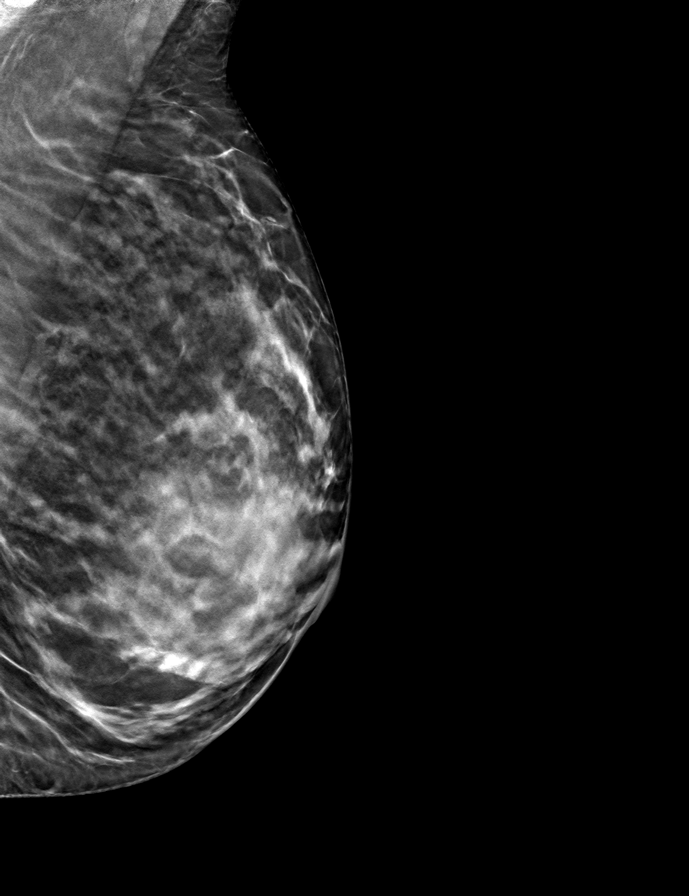
[frame 35/68]
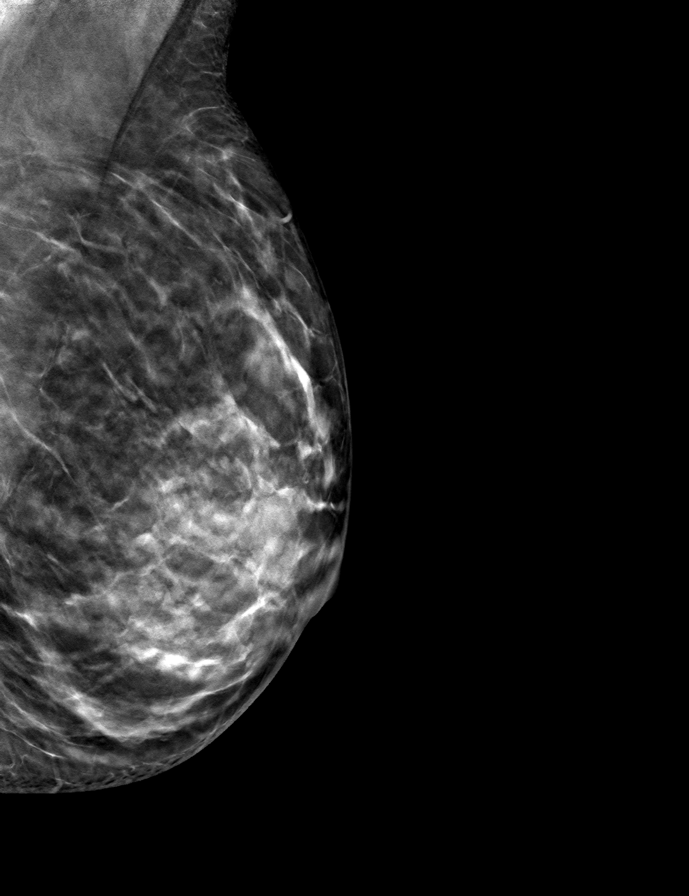

[L CC tomo · tomo slice 30/59.0]
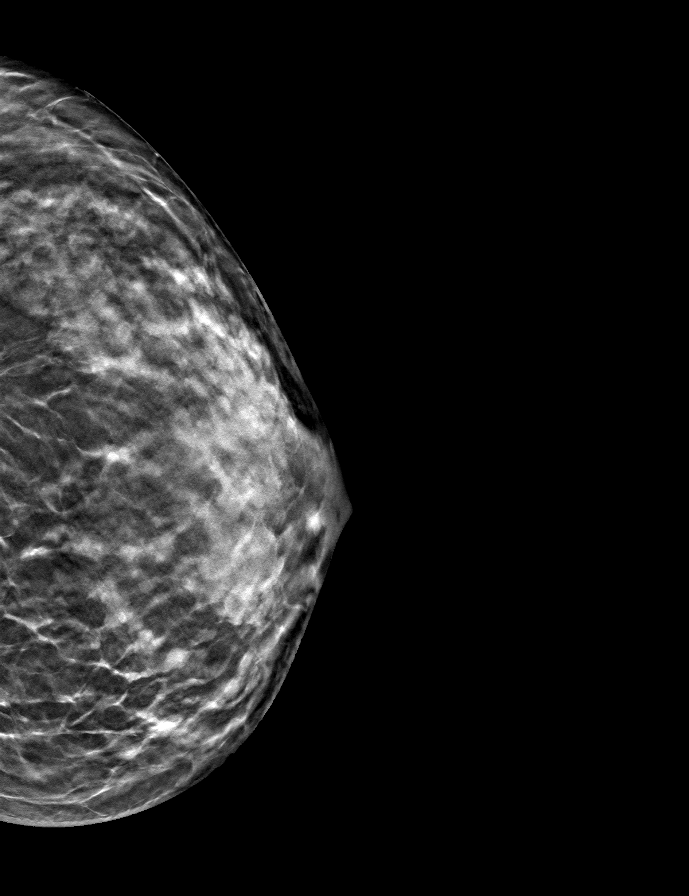

[R MLO tomo · tomo slice 31/62.0]
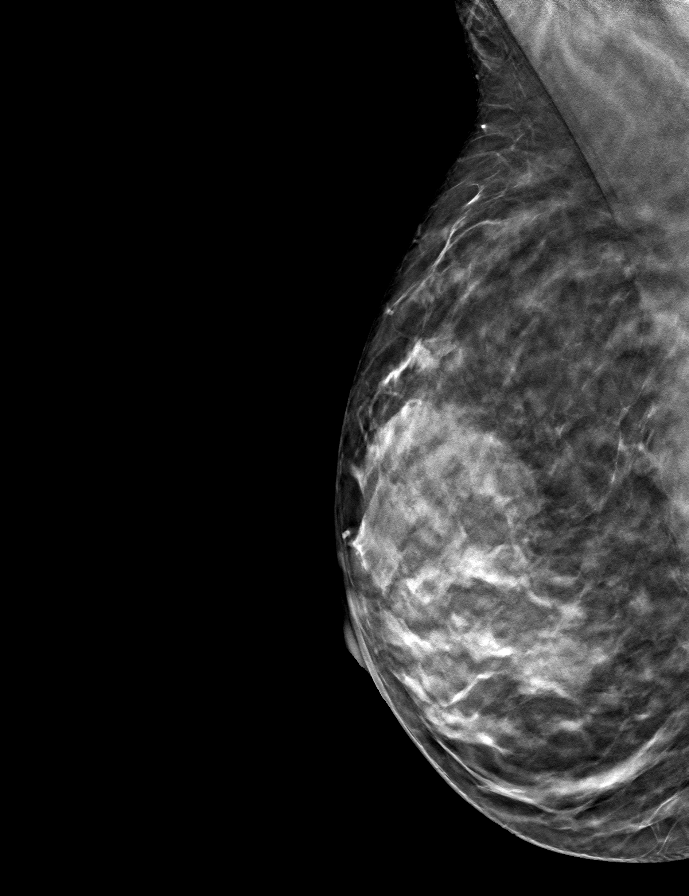

[R CC tomo · tomo slice 31/62.0]
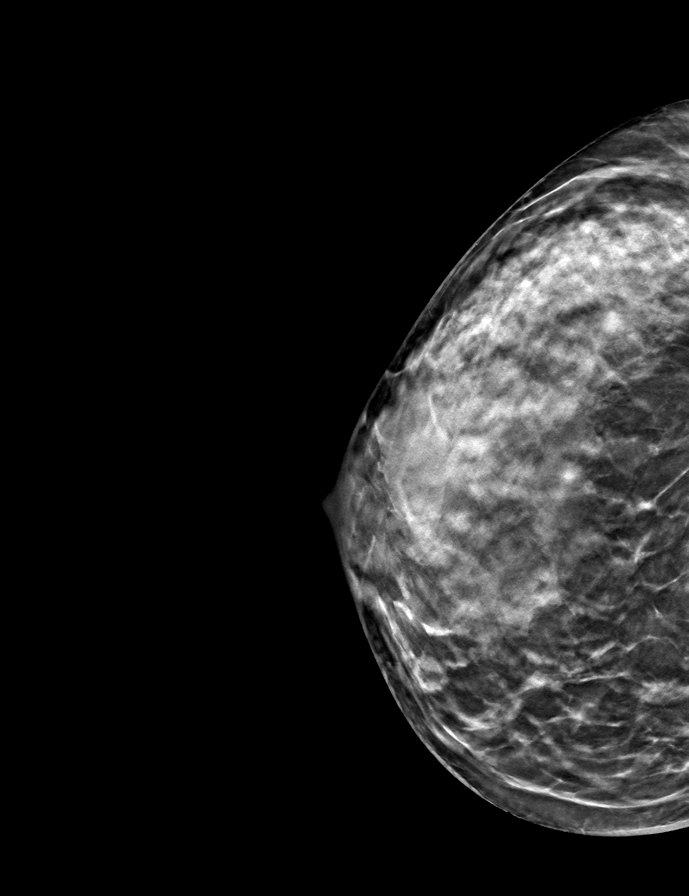

[9 of 24 positions shown; findings below may reference images not displayed]

ACR Breast Density Category d: The breast tissue is extremely dense,
which lowers the sensitivity of mammography.
FINDINGS: No suspicious calcifications, masses or areas of distortion are seen
in the bilateral breasts.

Mammographic images were processed with CAD.
IMPRESSION: 1. No mammographic findings in the right breast to explain the
patient's nonfocal right breast pain.

2.  No mammographic evidence of malignancy in the bilateral breasts.

RECOMMENDATION:
1. Clinical follow-up recommended for the right breast pain. Any
further workup should be based on clinical grounds.

2. Screening mammogram at age 40 unless there are persistent or
intervening clinical concerns. (Code:[id number]-H-9MT)

I have discussed the findings and recommendations with the patient.
If applicable, a reminder letter will be sent to the patient
regarding the next appointment.

BI-RADS CATEGORY  1: Negative.

## 2021-07-24 ENCOUNTER — Ambulatory Visit: Payer: Medicaid Other | Attending: Emergency Medicine | Admitting: Emergency Medicine

## 2021-07-24 ENCOUNTER — Other Ambulatory Visit: Payer: Self-pay

## 2021-07-24 ENCOUNTER — Ambulatory Visit: Payer: Medicaid Other

## 2021-07-24 ENCOUNTER — Ambulatory Visit
Admission: RE | Admit: 2021-07-24 | Discharge: 2021-07-24 | Disposition: A | Discharge status: Home / Self Care | Payer: Medicaid Other | Source: Ambulatory Visit

## 2021-07-24 VITALS — BP 135/87 | HR 81 | Temp 98.8°F | Resp 16

## 2021-07-24 DIAGNOSIS — W0110XA Fall on same level from slipping, tripping and stumbling with subsequent striking against unspecified object, initial encounter: Secondary | ICD-10-CM

## 2021-07-24 DIAGNOSIS — M25562 Pain in left knee: Secondary | ICD-10-CM

## 2021-07-24 DIAGNOSIS — S8002XA Contusion of left knee, initial encounter: Secondary | ICD-10-CM

## 2021-07-24 MED ORDER — NAPROXEN 500 MG PO TABS *I*
500.0000 mg | ORAL_TABLET | Freq: Two times a day (BID) | ORAL | 0 refills | Status: AC
Start: 2021-07-24 — End: 2021-07-31
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
## 2021-07-24 NOTE — Patient Instructions (Signed)
Rest  Rest is required to allow your body to heal. Generally, you can resume your routine activities when you are comfortable and have been given permission by your health care provider.  Ice  Icing your injury helps to keep the swelling down and it reduces pain. Do not apply ice directly to your skin.  Put ice in a plastic bag.  Place a towel between your skin and the bag.  Leave the ice on for 20 minutes, 2-3 times per day.  Do this for as long as you are directed by your health care provider.  Compression  Compression helps to keep swelling down, gives support, and helps with discomfort. Compression may be done with an elastic bandage.  Elevation  Elevation helps to reduce swelling and it decreases pain. If possible, your injured area should be placed at or above the level of your heart or the center of your chest.    Do not take any ibuprofen while taking naproxen    If you experience new onset of numbness, tingling, chest pain, shortness of breath, cold blue toes/fingers, or decreased range of motion please go to the Emergency Department to be further evaluated.

## 2021-08-26 ENCOUNTER — Other Ambulatory Visit
Admission: RE | Admit: 2021-08-26 | Discharge: 2021-08-26 | Disposition: A | Discharge status: Home / Self Care | Payer: Medicaid Other | Source: Ambulatory Visit | Attending: Obstetrics & Gynecology | Admitting: Obstetrics & Gynecology

## 2021-08-26 DIAGNOSIS — Z124 Encounter for screening for malignant neoplasm of cervix: Secondary | ICD-10-CM | POA: Insufficient documentation

## 2021-08-26 DIAGNOSIS — Z01419 Encounter for gynecological examination (general) (routine) without abnormal findings: Secondary | ICD-10-CM | POA: Insufficient documentation

## 2021-09-05 LAB — GYN CYTOLOGY

## 2021-12-03 ENCOUNTER — Other Ambulatory Visit: Payer: Self-pay

## 2021-12-03 ENCOUNTER — Ambulatory Visit: Payer: Medicaid Other

## 2021-12-03 DIAGNOSIS — Z23 Encounter for immunization: Secondary | ICD-10-CM

## 2021-12-03 NOTE — Progress Notes (Signed)
Employee Flu Vax- Tolerated Well in LT Deltoid - no issues   [employer]   FLULAVAL QUADRIVALENT  LOT- [id number]  EXP- 01/23/2022  NDC- 16109-604-54

## 2022-03-28 ENCOUNTER — Ambulatory Visit: Payer: Medicaid Other | Attending: Emergency Medicine | Admitting: Emergency Medicine

## 2022-03-28 ENCOUNTER — Encounter: Payer: Self-pay | Admitting: Emergency Medicine

## 2022-03-28 VITALS — BP 125/81 | HR 77 | Temp 97.2°F | Resp 18

## 2022-03-28 DIAGNOSIS — U071 COVID-19: Secondary | ICD-10-CM | POA: Insufficient documentation

## 2022-03-28 DIAGNOSIS — B349 Viral infection, unspecified: Secondary | ICD-10-CM

## 2022-03-28 NOTE — UC Provider Note (Signed)
Chief Complaint:   Chief Complaint   Patient presents with   ? Covid-19 Testing     COVID + home test ([employer] employee).  Sore throat, headache, chills started yesterday. Denies known fevers.       History provided by: patient    History limited by: n/a    Language interpreter used: no     HPI:  41 y.o. female w hx as noted in chart presents to Urgent Care with symptoms concerning for COVID 19. Patient complains of 1 day(s) of the following symptoms: headache chills  Positive covid    Denies: sob    Symptoms have remained the same           VITALS:  BP 125/81   Pulse 77   Temp 36.2 ?C (97.2 ?F) (Temporal)   Resp 18   SpO2 98%      ROS: See HPI above     PHYSICAL EXAM:  Appearance: Well appearing, no acute distress   Throat: speaking full sentenes  Neck: no bulging  CV: Regular rate  Pulm: no acute respiratory distress  MSK: no abnormal gait. Walking well.   Patient was asked by this provider if they wanted me to look at anything in particular. They responded "no". Hands off exam completed.       UC LABS:    No results found for this visit on 03/28/22.     MDM:  41 y.o. female w hx as noted in chart presents with symptoms concerning for COVID-19 vs other mild viral illness. COVID 19 PCR testing ordered (see above if other labs ordered). The patient was informed the COVID 19 result will be available in MyChart and the need for isolation per CDC guidelines. Recommend supportive care at this time. Advised close follow up w PCP as needed. Appropriate Handouts provided.     Given the patient's reassuring vital signs and overall well appearance, the patient can be managed safely at home. Hydration advised. Patient advised to go to ED for any worsening or concerning symptoms.   I have reviewed all labs and discharge instructions with the patient. I have answered all questions to the best of my knowledge. Patient/caregiver verbalizes understanding and is agreeable to discharge.    [employer] was used  for part/all of this encounter. Errors in grammar were changed and fixed to the best of my ability.     Orders Placed This Encounter   ? COVID/Influenza A & B/RSV NAAT (PCR)       No results found for this or any previous visit (from the past 24 hour(s)).        Final Diagnosis    ICD-10-CM ICD-9-CM   1. Viral illness  B34.9 079.99       Encourage fluids, encourage rest, good hand hygiene.    Use over the counter medications as discussed.    Please start the new medications as below:    Cannot display discharge medications since this is not an admission.      Please follow up with your physician as below:        Thank you Marvie Basom for coming to [facility] for your health care concerns.    If your condition changes and/or worsens please follow up with her primary doctor and/or return to the urgent care center.    If short of breath, chest pains or any other concerns please report to the emergency room.    In the event of an  Emergency dial 911.

## 2022-03-28 NOTE — Patient Instructions (Signed)
Quarantine at this time at home  Stay hydrated  Go to hospital if unable to eat drink breathe or function  Call primary if positive

## 2022-03-30 LAB — COVID/INFLUENZA A & B/RSV NAAT (PCR)
COVID-19 NAAT (PCR): POSITIVE — AB
Influenza A NAAT (PCR): NEGATIVE
Influenza B NAAT (PCR): NEGATIVE
RSV NAAT (PCR): NEGATIVE

## 2022-05-14 ENCOUNTER — Other Ambulatory Visit: Payer: Self-pay

## 2022-05-14 ENCOUNTER — Encounter: Payer: Self-pay | Admitting: Emergency Medicine

## 2022-05-14 ENCOUNTER — Ambulatory Visit: Payer: Medicaid Other | Attending: Emergency Medicine | Admitting: Emergency Medicine

## 2022-05-14 VITALS — BP 152/93 | HR 101 | Temp 97.9°F | Resp 16

## 2022-05-14 DIAGNOSIS — M549 Dorsalgia, unspecified: Secondary | ICD-10-CM | POA: Insufficient documentation

## 2022-05-14 DIAGNOSIS — M542 Cervicalgia: Secondary | ICD-10-CM | POA: Insufficient documentation

## 2022-05-14 MED ORDER — LIDOCAINE 5 % EX PTCH *I*
1.0000 | MEDICATED_PATCH | CUTANEOUS | 0 refills | Status: AC
Start: 2022-05-14 — End: 2022-05-24

## 2022-05-14 NOTE — Nursing Note (Signed)
Pt. Presents to Urgent Care with complaint of neck pain, upper left back pain under shoulder blade that started this am. Pt. Reports no known direct injury or trauma, but reports has been moving and lifting last couple of days.

## 2022-05-14 NOTE — Patient Instructions (Signed)
Rachel Spears, you were seen and treated today for low back pain.     Avoid bending/twisting/lifting for the next 48 hours  Begin activity as tolerated after 48 hours, as you do not want to over-do-it  Continue to apply heat to the area  Do not sleep with a heating pad    Continue Tylenol 1000 mg every 8 hours as needed for pain/fever.  Maximum daily dose = 3000 mg  Continue ibuprofen 600 mg ever 6 hours as needed for pain.   You can try sports rubs such as [address], Tiger Balm or [place]      If you develop worsening back pain, shortness of breath, chest pain, loss of bowel or bladder control, or weakness or numbness of your legs, please seek medical attention immediately.       Thank you Rachel Spears for choosing [facility] for your health concerns.    If your condition changes and/or worsens, please follow up with your primary care provider or return to [facility] urgent care for further evaluation.    If short of breath, chest pains or any other concerns please report to the emergency room.    In the event of an Emergency dial 911.
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
## 2024-03-05 ENCOUNTER — Other Ambulatory Visit: Payer: Self-pay

## 2024-03-05 ENCOUNTER — Ambulatory Visit

## 2024-03-05 VITALS — BP 134/80 | HR 84 | Temp 97.3°F | Resp 14

## 2024-03-05 DIAGNOSIS — R0982 Postnasal drip: Secondary | ICD-10-CM | POA: Insufficient documentation

## 2024-03-05 DIAGNOSIS — H9201 Otalgia, right ear: Secondary | ICD-10-CM | POA: Insufficient documentation

## 2024-03-05 MED ORDER — FLUTICASONE PROPIONATE 50 MCG/ACT NA SUSP *I*
1.0000 | Freq: Every day | NASAL | 0 refills | Status: AC
Start: 2024-03-05 — End: ?

## 2024-03-05 NOTE — Patient Instructions (Signed)
 Please continue to monitor for symptoms.  The most common side effect of Flonase  is nosebleeds.  If this starts to stop the medication.  You can use 1 nasal spray per nostril per day.  If symptoms fail to improve or worsen please feel free to return.
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
## 2024-08-31 ENCOUNTER — Ambulatory Visit: Payer: Self-pay | Admitting: Obstetrics & Gynecology
